# Patient Record
Sex: Male | Born: 1975
Health system: Southern US, Community
[De-identification: ages and names within clinical notes are randomized; demographics above are authoritative.]

## PROBLEM LIST (undated history)

## (undated) DIAGNOSIS — T7840XA Allergy, unspecified, initial encounter: Secondary | ICD-10-CM

## (undated) DIAGNOSIS — J45909 Unspecified asthma, uncomplicated: Secondary | ICD-10-CM

## (undated) HISTORY — DX: Unspecified asthma, uncomplicated: J45.909

## (undated) HISTORY — DX: Allergy, unspecified, initial encounter: T78.40XA

---

## 2011-04-15 ENCOUNTER — Ambulatory Visit: Payer: Self-pay | Admitting: Physician Assistant

## 2011-04-15 VITALS — BP 119/73 | HR 58 | Temp 98.3°F | Resp 16 | Ht 67.58 in | Wt 248.2 lb

## 2011-04-15 DIAGNOSIS — Z0289 Encounter for other administrative examinations: Secondary | ICD-10-CM

## 2011-04-15 NOTE — Progress Notes (Signed)
Patient ID: Johnny Barber MRN: [id number], DOB: 10-Mar-1975 36 y.o. Date of Encounter: 04/15/2011, 8:06 PM  Primary Physician: No primary provider on file.  Chief Complaint:  DOT Physical (CPE)  HPI: 36 y.o. y/o male with history noted below here for DOT CPE.  Doing well. No issues/complaints. Here for recertification. 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   Review of Systems: Consitutional: No fever, chills, fatigue, night sweats, lymphadenopathy, or weight changes. Eyes: No visual changes, eye redness, or discharge. ENT/Mouth: Ears: No otalgia, tinnitus, hearing loss, discharge. Nose: No congestion, rhinorrhea, sinus pain, or epistaxis. Throat: No sore throat, post nasal drip, or teeth pain. Cardiovascular: No CP, palpitations, diaphoresis, DOE, edema, orthopnea, PND. Respiratory: No cough, hemoptysis, SOB, or wheezing. Gastrointestinal: No anorexia, dysphagia, reflux, pain, nausea, vomiting, hematemesis, diarrhea, constipation, BRBPR, or melena. Genitourinary: No dysuria, frequency, urgency, hematuria, incontinence, nocturia, decreased urinary stream, discharge, impotence, or testicular pain/masses. Musculoskeletal: No decreased ROM, myalgias, stiffness, joint swelling, or weakness. Skin: No rash, erythema, lesion changes, pain, warmth, jaundice, or pruritis. Neurological: No headache, dizziness, syncope, seizures, tremors, memory loss, coordination problems, or paresthesias. Psychological: No anxiety, depression, hallucinations, SI/HI. Endocrine: No fatigue, polydipsia, polyphagia, polyuria, or known diabetes. All other systems were reviewed and are otherwise negative.  History reviewed. No pertinent past medical history.   History reviewed. No pertinent past surgical history.  Home Meds:  Prior to Admission medications   Not on File    Allergies: No Known Allergies  History   Social History  . Marital Status: Single   Spouse Name: N/A    Number of Children: N/A  . Years of Education: N/A   Occupational History  . Not on file.   Social History Main Topics  . Smoking status: Never Smoker   . Smokeless tobacco: Never Used  . Alcohol Use: Not on file  . Drug Use: No  . Sexually Active: Not on file   Other Topics Concern  . Not on file   Social History Narrative  . No narrative on file    History reviewed. No pertinent family history.  Physical Exam: Blood pressure 119/73, pulse 58, temperature 98.3 F (36.8 C), temperature source Oral, resp. rate 16, height 5' 7.58" (1.717 m), weight 248 lb 3.2 oz (112.583 kg).  General: Well developed, well nourished, in no acute distress. HEENT: Normocephalic, atraumatic. Conjunctiva pink, sclera non-icteric. Pupils 2 mm constricting to 1 mm, round, regular, and equally reactive to light and accomodation. EOMI. Internal auditory canal clear. TMs with good cone of light and without pathology. Nasal mucosa pink. Nares are without discharge. No sinus tenderness. Oral mucosa pink. Dentition normal. Pharynx without exudate.   Neck: Supple. Trachea midline. No thyromegaly. Full ROM. No lymphadenopathy. Lungs: Clear to auscultation bilaterally without wheezes, rales, or rhonchi. Breathing is of normal effort and unlabored. Cardiovascular: RRR with S1 S2. No murmurs, rubs, or gallops appreciated. Distal pulses 2+ symmetrically. No carotid or abdominal bruits. Abdomen: Soft, non-tender, non-distended with normoactive bowel sounds. No hepatosplenomegaly or masses. No rebound/guarding. No CVA tenderness. Without hernias.  Genitourinary: Circumcised male. No penile lesions. Testes descended bilaterally, and smooth without tenderness or masses.  Musculoskeletal: Full range of motion and 5/5 strength throughout. Without swelling, atrophy, tenderness, crepitus, or warmth. Extremities without clubbing, cyanosis, or edema. Calves supple. Skin: Warm and moist without erythema,  ecchymosis, wounds, or rash. Neuro: A+Ox3. CN II-XII grossly intact. Moves all extremities spontaneously. Full sensation throughout. Normal gait. DTR 2+  throughout upper and lower extremities. Finger to nose intact. Psych:  Responds to questions appropriately with a normal affect.    Assessment/Plan:  36 y.o. y/o male here for DOT CPE -Cleared -2 year card issued -Form completed -See scanned in form for specific data -Follow up with PCP for trace hematuria  Signed, Eula Listen, PA-C 04/15/2011 8:06 PM

## 2012-04-26 ENCOUNTER — Ambulatory Visit (INDEPENDENT_AMBULATORY_CARE_PROVIDER_SITE_OTHER): Payer: BC Managed Care – PPO | Admitting: Family Medicine

## 2012-04-26 VITALS — BP 126/78 | HR 62 | Temp 97.8°F | Resp 18 | Wt 255.0 lb

## 2012-04-26 DIAGNOSIS — E669 Obesity, unspecified: Secondary | ICD-10-CM

## 2012-04-26 DIAGNOSIS — L83 Acanthosis nigricans: Secondary | ICD-10-CM

## 2012-04-26 DIAGNOSIS — K219 Gastro-esophageal reflux disease without esophagitis: Secondary | ICD-10-CM

## 2012-04-26 DIAGNOSIS — M940 Chondrocostal junction syndrome [Tietze]: Secondary | ICD-10-CM

## 2012-04-26 DIAGNOSIS — J019 Acute sinusitis, unspecified: Secondary | ICD-10-CM

## 2012-04-26 DIAGNOSIS — R079 Chest pain, unspecified: Secondary | ICD-10-CM

## 2012-04-26 LAB — POCT CBC
Granulocyte percent: 60.4 %G (ref 37–80)
MID (cbc): 0.7 (ref 0–0.9)
POC Granulocyte: 3.8 (ref 2–6.9)
POC LYMPH PERCENT: 28.4 %L (ref 10–50)
POC MID %: 11.2 %M (ref 0–12)
Platelet Count, POC: 340 10*3/uL (ref 142–424)
RDW, POC: 13.8 %

## 2012-04-26 LAB — GLUCOSE, POCT (MANUAL RESULT ENTRY): POC Glucose: 103 mg/dl — AB (ref 70–99)

## 2012-04-26 MED ORDER — METHYLPREDNISOLONE ACETATE 80 MG/ML IJ SUSP
120.0000 mg | Freq: Once | INTRAMUSCULAR | Status: AC
  Administered 2012-04-26: 120 mg via INTRAMUSCULAR

## 2012-04-26 MED ORDER — IPRATROPIUM BROMIDE 0.03 % NA SOLN: 2.0000 | Freq: Four times a day (QID) | NASAL | Status: DC

## 2012-04-26 MED ORDER — FLUTICASONE PROPIONATE 50 MCG/ACT NA SUSP: 2.0000 | Freq: Every day | NASAL | Status: DC

## 2012-04-26 MED ORDER — MELOXICAM 15 MG PO TABS: 15.0000 mg | ORAL_TABLET | Freq: Every day | ORAL | Status: DC

## 2012-04-26 MED ORDER — RANITIDINE HCL 150 MG PO TABS: 150.0000 mg | ORAL_TABLET | Freq: Two times a day (BID) | ORAL | Status: DC

## 2012-04-26 NOTE — Progress Notes (Signed)
Subjective:    Patient ID: Johnny Barber, male    DOB: 09-Jul-1975, 37 y.o.   MRN: [id number] Chief Complaint  Patient presents with  . Chest Pain    couple days  . Allergic Rhinitis    HPI  Has had central chest pain constantly for the past few days. Nothing like this previous.  Is stabbing nature and exacerbated by sneezing.  No relieving factors.  No diaphoresis or dyspnea, no n/v, no radiation.  Not worsened by activity or exertion. No indigestion recently - does not eat pizza because of this.  Also had bad allergies and entire body is sore, sneezing a lot.  No coughing, is developing a headache as well.  Has tried some zyrtec. Notices that it does get worse when he goes away.  Is feeling like he had a fever, very fatigued, no sinus pressure but lots of nasal congestion and runny nose. No ear pain but neck feels stiff.  Tol po, nml GI/GU.    Past Medical History  Diagnosis Date  . Allergy   . Asthma    No current outpatient prescriptions on file prior to visit.   No current facility-administered medications on file prior to visit.   No Known Allergies  Review of Systems  Constitutional: Positive for fever and fatigue. Negative for chills, diaphoresis, activity change, appetite change and unexpected weight change.  HENT: Positive for congestion, sore throat, rhinorrhea, sneezing, neck stiffness and postnasal drip. Negative for ear pain, mouth sores, neck pain and sinus pressure.   Respiratory: Positive for cough. Negative for shortness of breath.   Cardiovascular: Positive for chest pain.  Gastrointestinal: Negative for nausea, vomiting, abdominal pain, diarrhea and constipation.  Genitourinary: Negative for dysuria, urgency and frequency.  Musculoskeletal: Positive for myalgias and arthralgias. Negative for joint swelling and gait problem.  Skin: Negative for rash.  Neurological: Positive for headaches. Negative for syncope.  Hematological: Negative for adenopathy.   Psychiatric/Behavioral: Positive for sleep disturbance.       BP 126/78  Pulse 62  Temp(Src) 97.8 F (36.6 C) (Oral)  Resp 18  Wt 255 lb (115.667 kg)  BMI 39.23 kg/m2  SpO2 98% Objective:   Physical Exam  Constitutional: He is oriented to person, place, and time. He appears well-developed and well-nourished. No distress.  HENT:  Head: Normocephalic and atraumatic.  Right Ear: External ear and ear canal normal. Tympanic membrane is retracted. A middle ear effusion is present.  Left Ear: External ear and ear canal normal. Tympanic membrane is retracted. A middle ear effusion is present.  Nose: Mucosal edema and rhinorrhea present. Right sinus exhibits maxillary sinus tenderness. Left sinus exhibits maxillary sinus tenderness.  Mouth/Throat: Uvula is midline and mucous membranes are normal. No oropharyngeal exudate, posterior oropharyngeal edema or posterior oropharyngeal erythema.  Bilateral canals erythematous  Bilateral temples swollen - per pt this is chronic  Eyes: Conjunctivae are normal. Right eye exhibits no discharge. Left eye exhibits no discharge. No scleral icterus.  Neck: Normal range of motion. Neck supple. No thyromegaly present.  Cardiovascular: Normal rate, regular rhythm, normal heart sounds and intact distal pulses.   Pulmonary/Chest: Effort normal and breath sounds normal. No respiratory distress. He exhibits bony tenderness. He exhibits no mass, no crepitus and no retraction.  Lymphadenopathy:       Head (left side): No submandibular adenopathy present.    He has no cervical adenopathy.       Right: No supraclavicular adenopathy present.       Left: No supraclavicular  adenopathy present.  Neurological: He is alert and oriented to person, place, and time.  Skin: Skin is warm and dry. He is not diaphoretic. No erythema.  Hyperpigmentation around posterior neck  Psychiatric: He has a normal mood and affect. His behavior is normal.   Results for orders placed in  visit on 04/26/12  POCT CBC      Result Value Range   WBC 6.3  4.6 - 10.2 K/uL   Lymph, poc 1.8  0.6 - 3.4   POC LYMPH PERCENT 28.4  10 - 50 %L   MID (cbc) 0.7  0 - 0.9   POC MID % 11.2  0 - 12 %M   POC Granulocyte 3.8  2 - 6.9   Granulocyte percent 60.4  37 - 80 %G   RBC 4.78  4.69 - 6.13 M/uL   Hemoglobin 13.9 (*) 14.1 - 18.1 g/dL   HCT, POC 01.0  27.2 - 53.7 %   MCV 91.1  80 - 97 fL   MCH, POC 29.1  27 - 31.2 pg   MCHC 32.0  31.8 - 35.4 g/dL   RDW, POC 53.6     Platelet Count, POC 340  142 - 424 K/uL   MPV 8.2  0 - 99.8 fL  GLUCOSE, POCT (MANUAL RESULT ENTRY)      Result Value Range   POC Glucose 103 (*) 70 - 99 mg/dl      [facility] reading (PRIMARY) by  Dr. Clelia Croft. EKG: NSR, no ischemic abnormalities  Assessment & Plan:  Sinusitis, acute - Plan: POCT CBC, methylPREDNISolone acetate (DEPO-MEDROL) injection 120 mg IM x 1 in office now. Try flonase, atrovent, try netti pot/sinus rinse, hot shower, humidifier, etc.  Chest pain - Plan: EKG 12-Lead - non-cardiac - suspect costochondritis as cause, RTC for further eval if continuing.  Consider   Costochondritis - Plan: methylPREDNISolone acetate (DEPO-MEDROL) injection 120 mg - try meloxicam (stay away from other nsaids due to diet-controlled gerd).  Obesity, unspecified - Plan: POCT glucose (manual entry)  Acanthosis nigricans - Plan: POCT glucose (manual entry)  GERD (gastroesophageal reflux disease) - start prn zantac  Meds ordered this encounter  Medications  . ranitidine (ZANTAC) 150 MG tablet    Sig: Take 1 tablet (150 mg total) by mouth 2 (two) times daily.    Dispense:  60 tablet    Refill:  0  . methylPREDNISolone acetate (DEPO-MEDROL) injection 120 mg    Sig:   . meloxicam (MOBIC) 15 MG tablet    Sig: Take 1 tablet (15 mg total) by mouth daily.    Dispense:  30 tablet    Refill:  0  . fluticasone (FLONASE) 50 MCG/ACT nasal spray    Sig: Place 2 sprays into the nose daily.    Dispense:  16 g    Refill:  6  .  ipratropium (ATROVENT) 0.03 % nasal spray    Sig: Place 2 sprays into the nose 4 (four) times daily.    Dispense:  30 mL    Refill:  1

## 2012-04-26 NOTE — Patient Instructions (Signed)
Hot showers or breathing in steam may help loosen the congestion.  Using a netti pot or sinus rinse is also likely to help you feel better and keep this from progressing.  Use the atrovent nasal spray as needed throughout the day and use the fluticasone nasal spray every night before bed for at least 2 weeks.  I recommend augmenting with 12 hr sudafed (behind the counter) and generic mucinex to help you move out the congestion.  If no improvement or you are getting worse, come back as you might need a course of steroids but hopefully with all of the above, you can avoid it. Sinusitis Sinusitis is redness, soreness, and swelling (inflammation) of the paranasal sinuses. Paranasal sinuses are air pockets within the bones of your face (beneath the eyes, the middle of the forehead, or above the eyes). In healthy paranasal sinuses, mucus is able to drain out, and air is able to circulate through them by way of your nose. However, when your paranasal sinuses are inflamed, mucus and air can become trapped. This can allow bacteria and other germs to grow and cause infection. Sinusitis can develop quickly and last only a short time (acute) or continue over a long period (chronic). Sinusitis that lasts for more than 12 weeks is considered chronic.  CAUSES  Causes of sinusitis include:  Allergies.  Structural abnormalities, such as displacement of the cartilage that separates your nostrils (deviated septum), which can decrease the air flow through your nose and sinuses and affect sinus drainage.  Functional abnormalities, such as when the small hairs (cilia) that line your sinuses and help remove mucus do not work properly or are not present. SYMPTOMS  Symptoms of acute and chronic sinusitis are the same. The primary symptoms are pain and pressure around the affected sinuses. Other symptoms include:  Upper toothache.  Earache.  Headache.  Bad breath.  Decreased sense of smell and taste.  A cough, which  worsens when you are lying flat.  Fatigue.  Fever.  Thick drainage from your nose, which often is green and may contain pus (purulent).  Swelling and warmth over the affected sinuses. DIAGNOSIS  Your caregiver will perform a physical exam. During the exam, your caregiver may:  Look in your nose for signs of abnormal growths in your nostrils (nasal polyps).  Tap over the affected sinus to check for signs of infection.  View the inside of your sinuses (endoscopy) with a special imaging device with a light attached (endoscope), which is inserted into your sinuses. If your caregiver suspects that you have chronic sinusitis, one or more of the following tests may be recommended:  Allergy tests.  Nasal culture A sample of mucus is taken from your nose and sent to a lab and screened for bacteria.  Nasal cytology A sample of mucus is taken from your nose and examined by your caregiver to determine if your sinusitis is related to an allergy. TREATMENT  Most cases of acute sinusitis are related to a viral infection and will resolve on their own within 10 days. Sometimes medicines are prescribed to help relieve symptoms (pain medicine, decongestants, nasal steroid sprays, or saline sprays).  However, for sinusitis related to a bacterial infection, your caregiver will prescribe antibiotic medicines. These are medicines that will help kill the bacteria causing the infection.  Rarely, sinusitis is caused by a fungal infection. In theses cases, your caregiver will prescribe antifungal medicine. For some cases of chronic sinusitis, surgery is needed. Generally, these are cases in   cases in which sinusitis recurs more than 3 times per year, despite other treatments. HOME CARE INSTRUCTIONS   Drink plenty of water. Water helps thin the mucus so your sinuses can drain more easily.  Use a humidifier.  Inhale steam 3 to 4 times a day (for example, sit in the bathroom with the shower running).  Apply a  warm, moist washcloth to your face 3 to 4 times a day, or as directed by your caregiver.  Use saline nasal sprays to help moisten and clean your sinuses.  Take over-the-counter or prescription medicines for pain, discomfort, or fever only as directed by your caregiver. SEEK IMMEDIATE MEDICAL CARE IF:  You have increasing pain or severe headaches.  You have nausea, vomiting, or drowsiness.  You have swelling around your face.  You have vision problems.  You have a stiff neck.  You have difficulty breathing. MAKE SURE YOU:   Understand these instructions.  Will watch your condition.  Will get help right away if you are not doing well or get worse. Document Released: 12/30/2004 Document Revised: 03/24/2011 Document Reviewed: 01/14/2011 [facility] Patient Information 2013 [place], [place].

## 2012-05-10 ENCOUNTER — Ambulatory Visit (INDEPENDENT_AMBULATORY_CARE_PROVIDER_SITE_OTHER): Payer: BC Managed Care – PPO | Admitting: Family Medicine

## 2012-05-10 VITALS — BP 124/72 | HR 65 | Temp 98.0°F | Resp 16 | Ht 68.5 in | Wt 255.0 lb

## 2012-05-10 DIAGNOSIS — R51 Headache: Secondary | ICD-10-CM

## 2012-05-10 DIAGNOSIS — J0100 Acute maxillary sinusitis, unspecified: Secondary | ICD-10-CM

## 2012-05-10 DIAGNOSIS — R5381 Other malaise: Secondary | ICD-10-CM

## 2012-05-10 DIAGNOSIS — R29898 Other symptoms and signs involving the musculoskeletal system: Secondary | ICD-10-CM

## 2012-05-10 DIAGNOSIS — R42 Dizziness and giddiness: Secondary | ICD-10-CM

## 2012-05-10 DIAGNOSIS — J019 Acute sinusitis, unspecified: Secondary | ICD-10-CM

## 2012-05-10 DIAGNOSIS — IMO0001 Reserved for inherently not codable concepts without codable children: Secondary | ICD-10-CM

## 2012-05-10 LAB — COMPREHENSIVE METABOLIC PANEL
ALT: 31 U/L (ref 0–53)
AST: 19 U/L (ref 0–37)
BUN: 16 mg/dL (ref 6–23)
Creat: 1.29 mg/dL (ref 0.50–1.35)
Total Bilirubin: 0.6 mg/dL (ref 0.3–1.2)

## 2012-05-10 LAB — POCT CBC
HCT, POC: 45.3 % (ref 43.5–53.7)
Hemoglobin: 14.1 g/dL (ref 14.1–18.1)
Lymph, poc: 2 (ref 0.6–3.4)
MCHC: 31.1 g/dL — AB (ref 31.8–35.4)
POC Granulocyte: 4.8 (ref 2–6.9)
WBC: 7.4 10*3/uL (ref 4.6–10.2)

## 2012-05-10 LAB — TSH: TSH: 2.245 u[IU]/mL (ref 0.350–4.500)

## 2012-05-10 LAB — GLUCOSE, POCT (MANUAL RESULT ENTRY): POC Glucose: 105 mg/dl — AB (ref 70–99)

## 2012-05-10 MED ORDER — AMOXICILLIN-POT CLAVULANATE 875-125 MG PO TABS: 1.0000 | ORAL_TABLET | Freq: Two times a day (BID) | ORAL | Status: DC

## 2012-05-10 MED ORDER — PREDNISONE 20 MG PO TABS: ORAL_TABLET | ORAL | Status: DC

## 2012-05-10 NOTE — Progress Notes (Signed)
  Subjective:    Patient ID: Johnny Barber, male    DOB: [birth year], 37 y.o.   MRN: [id number]  HPI  We gave him some depomedrol in the office which he feels better.  When he sneezes hard, he feels unconscious for about 10 sec - more like dizziness, feels like everything shuts down. it is a while before he can remember anything.  Having a posterior headache, still with a lot of nasal congestion Chest pain is gone but whoe body is sore.  Feels fever.    Review of Systems     Objective:   Physical Exam        Results for orders placed in visit on 05/10/12  GLUCOSE, POCT (MANUAL RESULT ENTRY)      Result Value Range   POC Glucose 105 (*) 70 - 99 mg/dl  POCT CBC      Result Value Range   WBC 7.4  4.6 - 10.2 K/uL   Lymph, poc 2.0  0.6 - 3.4   POC LYMPH PERCENT 26.9  10 - 50 %L   MID (cbc) 0.6  0 - 0.9   POC MID % 8.3  0 - 12 %M   POC Granulocyte 4.8  2 - 6.9   Granulocyte percent 64.8  37 - 80 %G   RBC 4.97  4.69 - 6.13 M/uL   Hemoglobin 14.1  14.1 - 18.1 g/dL   HCT, POC 40.9  81.1 - 53.7 %   MCV 91.2  80 - 97 fL   MCH, POC 28.4  27 - 31.2 pg   MCHC 31.1 (*) 31.8 - 35.4 g/dL   RDW, POC 91.4     Platelet Count, POC 360  142 - 424 K/uL   MPV 8.5  0 - 99.8 fL    Assessment & Plan:

## 2012-05-10 NOTE — Patient Instructions (Addendum)
Hot showers or breathing in steam may help loosen the congestion.  Using a netti pot or sinus rinse is also likely to help you feel better and keep this from progressing.  Make sure you do this all twice a day but especially at night, right before bed.  Use the atrovent nasal spray as needed throughout the day and use the fluticasone nasal spray every night before bed for at least 2 weeks.  I recommend augmenting with 12 hr sudafed (behind the counter) and generic mucinex to help you move out the congestion.  If no improvement or you are getting worse, come back.  Sinusitis Sinusitis is redness, soreness, and swelling (inflammation) of the paranasal sinuses. Paranasal sinuses are air pockets within the bones of your face (beneath the eyes, the middle of the forehead, or above the eyes). In healthy paranasal sinuses, mucus is able to drain out, and air is able to circulate through them by way of your nose. However, when your paranasal sinuses are inflamed, mucus and air can become trapped. This can allow bacteria and other germs to grow and cause infection. Sinusitis can develop quickly and last only a short time (acute) or continue over a long period (chronic). Sinusitis that lasts for more than 12 weeks is considered chronic.  CAUSES  Causes of sinusitis include:  Allergies.  Structural abnormalities, such as displacement of the cartilage that separates your nostrils (deviated septum), which can decrease the air flow through your nose and sinuses and affect sinus drainage.  Functional abnormalities, such as when the small hairs (cilia) that line your sinuses and help remove mucus do not work properly or are not present. SYMPTOMS  Symptoms of acute and chronic sinusitis are the same. The primary symptoms are pain and pressure around the affected sinuses. Other symptoms include:  Upper toothache.  Earache.  Headache.  Bad breath.  Decreased sense of smell and taste.  A cough, which worsens  when you are lying flat.  Fatigue.  Fever.  Thick drainage from your nose, which often is green and may contain pus (purulent).  Swelling and warmth over the affected sinuses. DIAGNOSIS  Your caregiver will perform a physical exam. During the exam, your caregiver may:  Look in your nose for signs of abnormal growths in your nostrils (nasal polyps).  Tap over the affected sinus to check for signs of infection.  View the inside of your sinuses (endoscopy) with a special imaging device with a light attached (endoscope), which is inserted into your sinuses. If your caregiver suspects that you have chronic sinusitis, one or more of the following tests may be recommended:  Allergy tests.  Nasal culture A sample of mucus is taken from your nose and sent to a lab and screened for bacteria.  Nasal cytology A sample of mucus is taken from your nose and examined by your caregiver to determine if your sinusitis is related to an allergy. TREATMENT  Most cases of acute sinusitis are related to a viral infection and will resolve on their own within 10 days. Sometimes medicines are prescribed to help relieve symptoms (pain medicine, decongestants, nasal steroid sprays, or saline sprays).  However, for sinusitis related to a bacterial infection, your caregiver will prescribe antibiotic medicines. These are medicines that will help kill the bacteria causing the infection.  Rarely, sinusitis is caused by a fungal infection. In theses cases, your caregiver will prescribe antifungal medicine. For some cases of chronic sinusitis, surgery is needed. Generally, these are cases in which  sinusitis recurs more than 3 times per year, despite other treatments. HOME CARE INSTRUCTIONS   Drink plenty of water. Water helps thin the mucus so your sinuses can drain more easily.  Use a humidifier.  Inhale steam 3 to 4 times a day (for example, sit in the bathroom with the shower running).  Apply a warm, moist  washcloth to your face 3 to 4 times a day, or as directed by your caregiver.  Use saline nasal sprays to help moisten and clean your sinuses.  Take over-the-counter or prescription medicines for pain, discomfort, or fever only as directed by your caregiver. SEEK IMMEDIATE MEDICAL CARE IF:  You have increasing pain or severe headaches.  You have nausea, vomiting, or drowsiness.  You have swelling around your face.  You have vision problems.  You have a stiff neck.  You have difficulty breathing. MAKE SURE YOU:   Understand these instructions.  Will watch your condition.  Will get help right away if you are not doing well or get worse. Document Released: 12/30/2004 Document Revised: 03/24/2011 Document Reviewed: 01/14/2011 [facility] Patient Information 2013 [place], [place].

## 2013-04-12 ENCOUNTER — Ambulatory Visit: Payer: Self-pay | Admitting: Emergency Medicine

## 2013-04-12 VITALS — BP 100/78 | HR 57 | Temp 98.3°F | Resp 16 | Ht 68.0 in | Wt 248.0 lb

## 2013-04-12 DIAGNOSIS — Z0289 Encounter for other administrative examinations: Secondary | ICD-10-CM

## 2013-04-12 NOTE — Progress Notes (Signed)
[facility] [address], [place] [id number] [phone]  Date:  04/12/2013   Name:  Johnny Barber   DOB:  [birth year]   MRN:  [id number]  PCP:  No PCP Per Patient    Chief Complaint: DOT PE   History of Present Illness:  Johnny Barber is a 38 y.o. very pleasant male patient who presents with the following:  DOT  There are no active problems to display for this patient.   Past Medical History  Diagnosis Date  . Allergy   . Asthma     No past surgical history on file.  History  Substance Use Topics  . Smoking status: Never Smoker   . Smokeless tobacco: Never Used  . Alcohol Use: Yes    No family history on file.  No Known Allergies  Medication list has been reviewed and updated.  No current outpatient prescriptions on file prior to visit.   No current facility-administered medications on file prior to visit.    Review of Systems:  As per HPI, otherwise negative.    Physical Examination: Filed Vitals:   04/12/13 1829  BP: 100/78  Pulse: 57  Temp: 98.3 F (36.8 C)  Resp: 16   Filed Vitals:   04/12/13 1829  Height: 5\' 8"  (1.727 m)  Weight: 248 lb (112.492 kg)   Body mass index is 37.72 kg/(m^2). Ideal Body Weight: Weight in (lb) to have BMI = 25: 164.1  GEN: WDWN, NAD, Non-toxic, A & O x 3 HEENT: Atraumatic, Normocephalic. Neck supple. No masses, No LAD. Ears and Nose: No external deformity. CV: RRR, No M/G/R. No JVD. No thrill. No extra heart sounds. PULM: CTA B, no wheezes, crackles, rhonchi. No retractions. No resp. distress. No accessory muscle use. ABD: S, NT, ND, +BS. No rebound. No HSM. EXTR: No c/c/e NEURO Normal gait.  PSYCH: Normally interactive. Conversant. Not depressed or anxious appearing.  Calm demeanor.    Assessment and Plan: DOT  Signed,  Phillips OdorJeffery Harnoor Kohles, MD

## 2013-04-25 ENCOUNTER — Ambulatory Visit (INDEPENDENT_AMBULATORY_CARE_PROVIDER_SITE_OTHER): Payer: BC Managed Care – PPO | Admitting: Internal Medicine

## 2013-04-25 VITALS — BP 134/84 | HR 57 | Temp 97.9°F | Resp 16 | Ht 67.25 in | Wt 248.0 lb

## 2013-04-25 DIAGNOSIS — J309 Allergic rhinitis, unspecified: Secondary | ICD-10-CM

## 2013-04-25 DIAGNOSIS — J3489 Other specified disorders of nose and nasal sinuses: Secondary | ICD-10-CM

## 2013-04-25 DIAGNOSIS — R3981 Functional urinary incontinence: Secondary | ICD-10-CM

## 2013-04-25 DIAGNOSIS — R0982 Postnasal drip: Secondary | ICD-10-CM

## 2013-04-25 DIAGNOSIS — J302 Other seasonal allergic rhinitis: Secondary | ICD-10-CM

## 2013-04-25 MED ORDER — FLUTICASONE PROPIONATE 50 MCG/ACT NA SUSP: NASAL | Status: DC

## 2013-04-25 MED ORDER — PREDNISONE 20 MG PO TABS: ORAL_TABLET | ORAL | Status: DC

## 2013-04-25 NOTE — Progress Notes (Signed)
Subjective:     Patient ID: Johnny Barber, male   DOB: 05-23-1975, 38 y.o.   MRN: [id number]  HPI 38 YO AA male [facility] with 2-3 days of nasal congestion, rhinorrhea, post nasal drip, eye itching, sneezing, and rib pain secondary to sneezing. He states that the same exact symptoms happened to him last year and were alleviated when he was given a steroid dose pack. See Chart. He is currently not taking any other medications and is not taking any acute medications to alleviate his symptoms. He says it is worse when he is outside and and that he has taken claritin in the past but is not currently taking it because it dries him out.   Review of Systems  Constitutional: Positive for activity change. Negative for fever, chills, appetite change and fatigue.  HENT: Positive for congestion, postnasal drip, rhinorrhea, sinus pressure and sneezing. Negative for ear discharge, ear pain, facial swelling, hearing loss, sore throat, tinnitus and voice change.   Eyes: Positive for pain, redness and itching. Negative for photophobia, discharge and visual disturbance.  Respiratory: Negative for cough, choking, chest tightness, shortness of breath and wheezing.   Cardiovascular: Negative for chest pain, palpitations and leg swelling.  Gastrointestinal: Negative for nausea, vomiting, abdominal pain, diarrhea, constipation, blood in stool and abdominal distention.  Endocrine: Negative for polyuria.  Genitourinary: Negative for dysuria and difficulty urinating.  Musculoskeletal: Negative for arthralgias, back pain and myalgias.       Pain in ribs secondary to sneezing  Skin: Negative for pallor and rash.  Allergic/Immunologic: Positive for environmental allergies.  Neurological: Positive for dizziness, weakness and light-headedness. Negative for tremors, syncope and headaches.       Objective:   Physical Exam  Constitutional: He is oriented to person, place, and time. He appears well-developed and  well-nourished. No distress.  HENT:  Head: Normocephalic and atraumatic.  Right Ear: External ear normal.  Left Ear: External ear normal.  Mouth/Throat: Oropharynx is clear and moist. No oropharyngeal exudate.  Eyes: Pupils are equal, round, and reactive to light. Right eye exhibits no discharge. No scleral icterus.  Red swollen eyes  Neck: Normal range of motion. Neck supple.  Cardiovascular: Normal rate, regular rhythm, normal heart sounds and intact distal pulses.  Exam reveals no gallop and no friction rub.   No murmur heard. Pulmonary/Chest: Effort normal and breath sounds normal. No respiratory distress. He has no wheezes. He has no rales. He exhibits no tenderness.  Abdominal: Soft. Bowel sounds are normal. He exhibits no distension and no mass. There is no tenderness. There is no rebound and no guarding.  Musculoskeletal: He exhibits no edema.  Neurological: He is alert and oriented to person, place, and time.  Skin: Skin is warm and dry. No rash noted. He is not diaphoretic. No erythema.       Assessment:    1) seasonal allergies 2) rhinorrhea 3) post nasal drip     Plan:    1) prednisone 12 d taper, use Claritin as tolerated for supplemental therapy, continue fluticasone 1 spray each nostril twice daily, recheck as needed or if worsening  Meds ordered this encounter  Medications  . predniSONE (DELTASONE) 20 MG tablet    Sig: 4/4/4/3/3/3/2/2/2/1/1/1single daily dose for 12 days    Dispense:  30 tablet    Refill:  0  . fluticasone (FLONASE) 50 MCG/ACT nasal spray    Sig: 1 spray each nostril twice a day    Dispense:  16 g  Refill:  6     I have completed the patient encounter in its entirety as documented by B Adams-Rad Gramling MS4, with editing by me where necessary. Nakiesha Rumsey P. Merla Richesoolittle, M.D.

## 2014-04-02 ENCOUNTER — Ambulatory Visit (INDEPENDENT_AMBULATORY_CARE_PROVIDER_SITE_OTHER): Payer: BLUE CROSS/BLUE SHIELD | Admitting: Physician Assistant

## 2014-04-02 VITALS — BP 116/64 | HR 68 | Temp 97.5°F | Resp 16 | Ht 68.25 in | Wt 238.6 lb

## 2014-04-02 DIAGNOSIS — J3089 Other allergic rhinitis: Secondary | ICD-10-CM

## 2014-04-02 MED ORDER — LEVOCETIRIZINE DIHYDROCHLORIDE 5 MG PO TABS: 5.0000 mg | ORAL_TABLET | Freq: Every evening | ORAL | Status: DC

## 2014-04-02 NOTE — Progress Notes (Signed)
   Subjective:    Patient ID: Johnny Barber, male    DOB: [birth year], 39 y.o.   MRN: [id number]  HPI Patient presents for 1 week of sneezing and itchy, watery eyes. Has also had congestion and some cough. Sneezing so hard it hurts his body. Denies fever, sinus/ear pressure, rhinorrhea, wheezing, HA, fatigue or N/V. Has seasonal allergies every year this time and the past 2 eyears has received prednisone and would like to have that again. Denies sick contacts. Has not taken any OTC remedies. Denies h/o asthma.    Review of Systems  Constitutional: Negative for fever, chills and fatigue.  HENT: Positive for congestion and sneezing. Negative for ear discharge, ear pain, rhinorrhea, sinus pressure and sore throat.   Eyes: Positive for discharge and itching. Negative for pain and redness.  Respiratory: Positive for cough. Negative for chest tightness, shortness of breath and wheezing.   Cardiovascular: Negative for chest pain.  Gastrointestinal: Negative for nausea and vomiting.  Neurological: Negative for headaches.       Objective:   Physical Exam  Constitutional: He is oriented to person, place, and time. He appears well-developed and well-nourished. No distress.  Blood pressure 116/64, pulse 68, temperature 97.5 F (36.4 C), temperature source Oral, resp. rate 16, height 5' 8.25" (1.734 m), weight 238 lb 9.6 oz (108.228 kg), SpO2 97 %.  HENT:  Head: Normocephalic and atraumatic.  Right Ear: Tympanic membrane, external ear and ear canal normal.  Left Ear: Tympanic membrane, external ear and ear canal normal.  Nose: Mucosal edema (marginal; nares are pale) and rhinorrhea present. No sinus tenderness. Right sinus exhibits no maxillary sinus tenderness and no frontal sinus tenderness. Left sinus exhibits no maxillary sinus tenderness and no frontal sinus tenderness.  Mouth/Throat: Uvula is midline, oropharynx is clear and moist and mucous membranes are normal. No oropharyngeal exudate,  posterior oropharyngeal edema or posterior oropharyngeal erythema.  Eyes: Conjunctivae are normal. Pupils are equal, round, and reactive to light. Right eye exhibits discharge. Left eye exhibits discharge. Right conjunctiva is not injected. Left conjunctiva is not injected. No scleral icterus.  Neck: Neck supple. No thyromegaly present.  Cardiovascular: Normal rate, regular rhythm and normal heart sounds.  Exam reveals no gallop and no friction rub.   No murmur heard. Pulmonary/Chest: Effort normal. No respiratory distress. He has no decreased breath sounds. He has no wheezes. He has no rhonchi. He has no rales. He exhibits no tenderness.  Lymphadenopathy:    He has no cervical adenopathy.  Neurological: He is alert and oriented to person, place, and time.  Skin: Skin is warm and dry. No rash noted. He is not diaphoretic. No erythema. No pallor.       Assessment & Plan:  1. Environmental and seasonal allergies At this time prednisone is not appropriate for this patient despite his insistence. If sx do not improve or worsen after trying Xyzal for 2 weeks then he should call back and we can discussed prednisone. Patient refuses all nasal sprays. Advised to increase fluid intake to keep mucus thin. Can use humidifier. May use ibuprofen for any pain sx. - levocetirizine (XYZAL) 5 MG tablet; Take 1 tablet (5 mg total) by mouth every evening.  Dispense: 30 tablet; Refill: 4   Shaindy Reader PA-C  [facility] 04/02/2014 1:55 PM

## 2014-04-05 NOTE — Progress Notes (Signed)
  Medical screening examination/treatment/procedure(s) were performed by non-physician practitioner and as supervising physician I was immediately available for consultation/collaboration.     

## 2014-04-06 ENCOUNTER — Telehealth: Payer: Self-pay

## 2014-04-06 NOTE — Telephone Encounter (Signed)
°  levocetirizine (XYZAL) 5 MG tablet Is not working.  [facility]  [phone]

## 2014-04-06 NOTE — Telephone Encounter (Signed)
Read through Tishira's note before calling pt. OV note says pt is to try Xyzal for two weeks before calling to get prednisone. Pt is persistent that he gets the prenisone. He states the Xyzal is making him weak and dizzy. He said he was not able to work today because of how tired he feels. Pt claims he is drinking plenty of foods. He refuses to take Xyzal. Please advise.

## 2014-04-10 MED ORDER — PREDNISONE 10 MG PO TABS: ORAL_TABLET | ORAL | Status: DC

## 2014-04-10 NOTE — Telephone Encounter (Signed)
States that Xyzal made him drowsy even after taking medication at 6 pm and could not function at work. Also made him feel weak and would like to course of prednisone. Will send short course of prednisone to pharmacy.

## 2014-04-18 ENCOUNTER — Other Ambulatory Visit: Payer: Self-pay | Admitting: Physician Assistant

## 2015-04-25 ENCOUNTER — Ambulatory Visit (INDEPENDENT_AMBULATORY_CARE_PROVIDER_SITE_OTHER): Payer: BLUE CROSS/BLUE SHIELD

## 2015-04-25 ENCOUNTER — Ambulatory Visit (INDEPENDENT_AMBULATORY_CARE_PROVIDER_SITE_OTHER): Payer: BLUE CROSS/BLUE SHIELD | Admitting: Urgent Care

## 2015-04-25 ENCOUNTER — Other Ambulatory Visit: Payer: BLUE CROSS/BLUE SHIELD

## 2015-04-25 VITALS — BP 118/74 | HR 67 | Temp 97.8°F | Resp 16 | Ht 68.0 in | Wt 240.0 lb

## 2015-04-25 DIAGNOSIS — H6983 Other specified disorders of Eustachian tube, bilateral: Secondary | ICD-10-CM

## 2015-04-25 DIAGNOSIS — H6993 Unspecified Eustachian tube disorder, bilateral: Secondary | ICD-10-CM

## 2015-04-25 DIAGNOSIS — J0100 Acute maxillary sinusitis, unspecified: Secondary | ICD-10-CM

## 2015-04-25 DIAGNOSIS — J309 Allergic rhinitis, unspecified: Secondary | ICD-10-CM

## 2015-04-25 DIAGNOSIS — R0789 Other chest pain: Secondary | ICD-10-CM

## 2015-04-25 MED ORDER — MONTELUKAST SODIUM 10 MG PO TABS: 10.0000 mg | ORAL_TABLET | Freq: Every day | ORAL | Status: DC

## 2015-04-25 MED ORDER — CETIRIZINE HCL 10 MG PO TABS: 10.0000 mg | ORAL_TABLET | Freq: Every day | ORAL | Status: DC

## 2015-04-25 MED ORDER — FLUTICASONE PROPIONATE 50 MCG/ACT NA SUSP: 2.0000 | Freq: Every day | NASAL | Status: DC

## 2015-04-25 MED ORDER — PREDNISONE 20 MG PO TABS: ORAL_TABLET | ORAL | Status: DC

## 2015-04-25 MED ORDER — AMOXICILLIN-POT CLAVULANATE 875-125 MG PO TABS: 1.0000 | ORAL_TABLET | Freq: Two times a day (BID) | ORAL | Status: DC

## 2015-04-25 MED ORDER — PSEUDOEPHEDRINE HCL ER 120 MG PO TB12: 120.0000 mg | ORAL_TABLET | Freq: Two times a day (BID) | ORAL | Status: DC

## 2015-04-25 NOTE — Progress Notes (Signed)
    MRN: [id number] DOB: May 19, 1975  Subjective:   Leeanne MannanSteven Shomaker is a 40 y.o. male presenting for chief complaint of Allergies; Sinusitis; and Nasal Congestion  Reports 3 week history of worsening congestion, ear popping, ear pain, sinus headaches, has difficulty breathing through his nose and is affecting his sleep, has sneezing, sneezing cause diffuse discomfort in his body. Has been taking Xyzal with minimal relief. Denies fever, sore throat, cough. Denies smoking cigarettes. No pets at home. Had asthma until his early 20's.  Viviann SpareSteven has a current medication list which includes the following prescription(s): levocetirizine and prednisone. Also has No Known Allergies.  Viviann SpareSteven  has a past medical history of Allergy and Asthma. Also  has no past surgical history on file.  Objective:   Vitals: BP 118/74 mmHg  Pulse 67  Temp(Src) 97.8 F (36.6 C) (Oral)  Resp 16  Ht 5\' 8"  (1.727 m)  Wt 240 lb (108.863 kg)  BMI 36.50 kg/m2  SpO2 98%  Physical Exam  Constitutional: He is oriented to person, place, and time. He appears well-developed and well-nourished.  HENT:  TM's flat bilaterally, no effusions or erythema. Nasal turbinates erythematous with thick clear mucus. Bilateral maxillary sinus tenderness. Postnasal drip present, without oropharyngeal exudates, erythema or abscesses.  Eyes: Right eye exhibits no discharge. Left eye exhibits no discharge. No scleral icterus.  Neck: Normal range of motion. Neck supple.  Cardiovascular: Normal rate, regular rhythm and intact distal pulses.  Exam reveals no gallop and no friction rub.   No murmur heard. Pulmonary/Chest: No respiratory distress. He has no wheezes. He has no rales.  Slightly decreased lung sounds.  Lymphadenopathy:    He has no cervical adenopathy.  Neurological: He is alert and oriented to person, place, and time.  Skin: Skin is warm and dry.   Dg Chest 2 View  04/25/2015  CLINICAL DATA:  Chest congestion. EXAM: CHEST  2  VIEW COMPARISON:  None. FINDINGS: The heart size and mediastinal contours are within normal limits. Both lungs are clear. The visualized skeletal structures are unremarkable. IMPRESSION: No active cardiopulmonary disease. Electronically Signed   By: Lupita RaiderJames  Green Jr, M.D.   On: 04/25/2015 19:12    Assessment and Plan :   1. Allergic rhinitis, unspecified allergic rhinitis type 2. Acute maxillary sinusitis, recurrence not specified 3. Eustachian tube dysfunction, bilateral 4. Atypical chest pain - Start prednisone and Augmentin. Continue aggressive allergy management with Zyrtec, Flonase, Sudafed. If not better despite aggressive allergy treatment, start Singulair. Referral to Allergist pending.  Wallis BambergMario Charina Fons, PA-C [facility] [phone] 04/25/2015 6:20 PM

## 2015-04-25 NOTE — Patient Instructions (Addendum)
Start with prednisone and Augmentin. Once you finish the prednisone, then start Zyrtec, Flonase and Sudafed. If your allergies persist in 2 weeks, start Singulair.    Allergic Rhinitis Allergic rhinitis is when the mucous membranes in the nose respond to allergens. Allergens are particles in the air that cause your body to have an allergic reaction. This causes you to release allergic antibodies. Through a chain of events, these eventually cause you to release histamine into the blood stream. Although meant to protect the body, it is this release of histamine that causes your discomfort, such as frequent sneezing, congestion, and an itchy, runny nose.  CAUSES Seasonal allergic rhinitis (hay fever) is caused by pollen allergens that may come from grasses, trees, and weeds. Year-round allergic rhinitis (perennial allergic rhinitis) is caused by allergens such as house dust mites, pet dander, and mold spores. SYMPTOMS  Nasal stuffiness (congestion).  Itchy, runny nose with sneezing and tearing of the eyes. DIAGNOSIS Your health care provider can help you determine the allergen or allergens that trigger your symptoms. If you and your health care provider are unable to determine the allergen, skin or blood testing may be used. Your health care provider will diagnose your condition after taking your health history and performing a physical exam. Your health care provider may assess you for other related conditions, such as asthma, pink eye, or an ear infection. TREATMENT Allergic rhinitis does not have a cure, but it can be controlled by:  Medicines that block allergy symptoms. These may include allergy shots, nasal sprays, and oral antihistamines.  Avoiding the allergen. Hay fever may often be treated with antihistamines in pill or nasal spray forms. Antihistamines block the effects of histamine. There are over-the-counter medicines that may help with nasal congestion and swelling around the eyes.  Check with your health care provider before taking or giving this medicine. If avoiding the allergen or the medicine prescribed do not work, there are many new medicines your health care provider can prescribe. Stronger medicine may be used if initial measures are ineffective. Desensitizing injections can be used if medicine and avoidance does not work. Desensitization is when a patient is given ongoing shots until the body becomes less sensitive to the allergen. Make sure you follow up with your health care provider if problems continue. HOME CARE INSTRUCTIONS It is not possible to completely avoid allergens, but you can reduce your symptoms by taking steps to limit your exposure to them. It helps to know exactly what you are allergic to so that you can avoid your specific triggers. SEEK MEDICAL CARE IF:  You have a fever.  You develop a cough that does not stop easily (persistent).  You have shortness of breath.  You start wheezing.  Symptoms interfere with normal daily activities.   This information is not intended to replace advice given to you by your health care provider. Make sure you discuss any questions you have with your health care provider.   Document Released: 09/24/2000 Document Revised: 01/20/2014 Document Reviewed: 09/06/2012 Elsevier Interactive Patient Education 2016 Elsevier Inc.    Sinusitis, Adult Sinusitis is redness, soreness, and inflammation of the paranasal sinuses. Paranasal sinuses are air pockets within the bones of your face. They are located beneath your eyes, in the middle of your forehead, and above your eyes. In healthy paranasal sinuses, mucus is able to drain out, and air is able to circulate through them by way of your nose. However, when your paranasal sinuses are inflamed, mucus and air  can become trapped. This can allow bacteria and other germs to grow and cause infection. Sinusitis can develop quickly and last only a short time (acute) or continue  over a long period (chronic). Sinusitis that lasts for more than 12 weeks is considered chronic. CAUSES Causes of sinusitis include:  Allergies.  Structural abnormalities, such as displacement of the cartilage that separates your nostrils (deviated septum), which can decrease the air flow through your nose and sinuses and affect sinus drainage.  Functional abnormalities, such as when the small hairs (cilia) that line your sinuses and help remove mucus do not work properly or are not present. SIGNS AND SYMPTOMS Symptoms of acute and chronic sinusitis are the same. The primary symptoms are pain and pressure around the affected sinuses. Other symptoms include:  Upper toothache.  Earache.  Headache.  Bad breath.  Decreased sense of smell and taste.  A cough, which worsens when you are lying flat.  Fatigue.  Fever.  Thick drainage from your nose, which often is green and may contain pus (purulent).  Swelling and warmth over the affected sinuses. DIAGNOSIS Your health care provider will perform a physical exam. During your exam, your health care provider may perform any of the following to help determine if you have acute sinusitis or chronic sinusitis:  Look in your nose for signs of abnormal growths in your nostrils (nasal polyps).  Tap over the affected sinus to check for signs of infection.  View the inside of your sinuses using an imaging device that has a light attached (endoscope). If your health care provider suspects that you have chronic sinusitis, one or more of the following tests may be recommended:  Allergy tests.  Nasal culture. A sample of mucus is taken from your nose, sent to a lab, and screened for bacteria.  Nasal cytology. A sample of mucus is taken from your nose and examined by your health care provider to determine if your sinusitis is related to an allergy. TREATMENT Most cases of acute sinusitis are related to a viral infection and will resolve on  their own within 10 days. Sometimes, medicines are prescribed to help relieve symptoms of both acute and chronic sinusitis. These may include pain medicines, decongestants, nasal steroid sprays, or saline sprays. However, for sinusitis related to a bacterial infection, your health care provider will prescribe antibiotic medicines. These are medicines that will help kill the bacteria causing the infection. Rarely, sinusitis is caused by a fungal infection. In these cases, your health care provider will prescribe antifungal medicine. For some cases of chronic sinusitis, surgery is needed. Generally, these are cases in which sinusitis recurs more than 3 times per year, despite other treatments. HOME CARE INSTRUCTIONS  Drink plenty of water. Water helps thin the mucus so your sinuses can drain more easily.  Use a humidifier.  Inhale steam 3-4 times a day (for example, sit in the bathroom with the shower running).  Apply a warm, moist washcloth to your face 3-4 times a day, or as directed by your health care provider.  Use saline nasal sprays to help moisten and clean your sinuses.  Take medicines only as directed by your health care provider.  If you were prescribed either an antibiotic or antifungal medicine, finish it all even if you start to feel better. SEEK IMMEDIATE MEDICAL CARE IF:  You have increasing pain or severe headaches.  You have nausea, vomiting, or drowsiness.  You have swelling around your face.  You have vision problems.  You  have a stiff neck.  You have difficulty breathing.   This information is not intended to replace advice given to you by your health care provider. Make sure you discuss any questions you have with your health care provider.   Document Released: 12/30/2004 Document Revised: 01/20/2014 Document Reviewed: 01/14/2011 Elsevier Interactive Patient Education 2016 [employer]     IF you received an x-ray today, you will receive an invoice from  [facility]. Please contact [facility] at [phone] with questions or concerns regarding your invoice.   IF you received labwork today, you will receive an invoice from [facility]. Please contact [facility] at [phone] with questions or concerns regarding your invoice.   Our billing staff will not be able to assist you with questions regarding bills from these companies.  You will be contacted with the lab results as soon as they are available. The fastest way to get your results is to activate your My Chart account. Instructions are located on the last page of this paperwork. If you have not heard from us regarding the results in 2 weeks, please contact this office.

## 2015-11-14 DIAGNOSIS — J3081 Allergic rhinitis due to animal (cat) (dog) hair and dander: Secondary | ICD-10-CM | POA: Diagnosis not present

## 2015-11-14 DIAGNOSIS — J301 Allergic rhinitis due to pollen: Secondary | ICD-10-CM | POA: Diagnosis not present

## 2015-11-14 DIAGNOSIS — J3089 Other allergic rhinitis: Secondary | ICD-10-CM | POA: Diagnosis not present

## 2015-11-16 DIAGNOSIS — J301 Allergic rhinitis due to pollen: Secondary | ICD-10-CM | POA: Diagnosis not present

## 2015-11-16 DIAGNOSIS — J3081 Allergic rhinitis due to animal (cat) (dog) hair and dander: Secondary | ICD-10-CM | POA: Diagnosis not present

## 2015-11-16 DIAGNOSIS — J3089 Other allergic rhinitis: Secondary | ICD-10-CM | POA: Diagnosis not present

## 2015-11-21 DIAGNOSIS — J3089 Other allergic rhinitis: Secondary | ICD-10-CM | POA: Diagnosis not present

## 2015-11-21 DIAGNOSIS — J301 Allergic rhinitis due to pollen: Secondary | ICD-10-CM | POA: Diagnosis not present

## 2015-11-21 DIAGNOSIS — J3081 Allergic rhinitis due to animal (cat) (dog) hair and dander: Secondary | ICD-10-CM | POA: Diagnosis not present

## 2015-11-23 DIAGNOSIS — J3089 Other allergic rhinitis: Secondary | ICD-10-CM | POA: Diagnosis not present

## 2015-11-23 DIAGNOSIS — J3081 Allergic rhinitis due to animal (cat) (dog) hair and dander: Secondary | ICD-10-CM | POA: Diagnosis not present

## 2015-11-23 DIAGNOSIS — J301 Allergic rhinitis due to pollen: Secondary | ICD-10-CM | POA: Diagnosis not present

## 2015-11-26 DIAGNOSIS — J301 Allergic rhinitis due to pollen: Secondary | ICD-10-CM | POA: Diagnosis not present

## 2015-11-26 DIAGNOSIS — J3081 Allergic rhinitis due to animal (cat) (dog) hair and dander: Secondary | ICD-10-CM | POA: Diagnosis not present

## 2015-11-26 DIAGNOSIS — J3089 Other allergic rhinitis: Secondary | ICD-10-CM | POA: Diagnosis not present

## 2015-12-03 DIAGNOSIS — J3081 Allergic rhinitis due to animal (cat) (dog) hair and dander: Secondary | ICD-10-CM | POA: Diagnosis not present

## 2015-12-03 DIAGNOSIS — J3089 Other allergic rhinitis: Secondary | ICD-10-CM | POA: Diagnosis not present

## 2015-12-03 DIAGNOSIS — J301 Allergic rhinitis due to pollen: Secondary | ICD-10-CM | POA: Diagnosis not present

## 2015-12-03 DIAGNOSIS — H1045 Other chronic allergic conjunctivitis: Secondary | ICD-10-CM | POA: Diagnosis not present

## 2015-12-04 DIAGNOSIS — J3089 Other allergic rhinitis: Secondary | ICD-10-CM | POA: Diagnosis not present

## 2015-12-04 DIAGNOSIS — J3081 Allergic rhinitis due to animal (cat) (dog) hair and dander: Secondary | ICD-10-CM | POA: Diagnosis not present

## 2015-12-04 DIAGNOSIS — J301 Allergic rhinitis due to pollen: Secondary | ICD-10-CM | POA: Diagnosis not present

## 2015-12-12 DIAGNOSIS — J3081 Allergic rhinitis due to animal (cat) (dog) hair and dander: Secondary | ICD-10-CM | POA: Diagnosis not present

## 2015-12-12 DIAGNOSIS — J3089 Other allergic rhinitis: Secondary | ICD-10-CM | POA: Diagnosis not present

## 2015-12-12 DIAGNOSIS — J301 Allergic rhinitis due to pollen: Secondary | ICD-10-CM | POA: Diagnosis not present

## 2015-12-19 DIAGNOSIS — J3081 Allergic rhinitis due to animal (cat) (dog) hair and dander: Secondary | ICD-10-CM | POA: Diagnosis not present

## 2015-12-19 DIAGNOSIS — J3089 Other allergic rhinitis: Secondary | ICD-10-CM | POA: Diagnosis not present

## 2015-12-19 DIAGNOSIS — J301 Allergic rhinitis due to pollen: Secondary | ICD-10-CM | POA: Diagnosis not present

## 2015-12-26 DIAGNOSIS — J3081 Allergic rhinitis due to animal (cat) (dog) hair and dander: Secondary | ICD-10-CM | POA: Diagnosis not present

## 2015-12-26 DIAGNOSIS — J301 Allergic rhinitis due to pollen: Secondary | ICD-10-CM | POA: Diagnosis not present

## 2015-12-26 DIAGNOSIS — J3089 Other allergic rhinitis: Secondary | ICD-10-CM | POA: Diagnosis not present

## 2016-01-02 DIAGNOSIS — J3081 Allergic rhinitis due to animal (cat) (dog) hair and dander: Secondary | ICD-10-CM | POA: Diagnosis not present

## 2016-01-02 DIAGNOSIS — J3089 Other allergic rhinitis: Secondary | ICD-10-CM | POA: Diagnosis not present

## 2016-01-02 DIAGNOSIS — J301 Allergic rhinitis due to pollen: Secondary | ICD-10-CM | POA: Diagnosis not present

## 2016-01-09 DIAGNOSIS — J3089 Other allergic rhinitis: Secondary | ICD-10-CM | POA: Diagnosis not present

## 2016-01-09 DIAGNOSIS — J301 Allergic rhinitis due to pollen: Secondary | ICD-10-CM | POA: Diagnosis not present

## 2016-01-09 DIAGNOSIS — J3081 Allergic rhinitis due to animal (cat) (dog) hair and dander: Secondary | ICD-10-CM | POA: Diagnosis not present

## 2016-01-17 DIAGNOSIS — J301 Allergic rhinitis due to pollen: Secondary | ICD-10-CM | POA: Diagnosis not present

## 2016-01-17 DIAGNOSIS — J3089 Other allergic rhinitis: Secondary | ICD-10-CM | POA: Diagnosis not present

## 2016-01-17 DIAGNOSIS — J3081 Allergic rhinitis due to animal (cat) (dog) hair and dander: Secondary | ICD-10-CM | POA: Diagnosis not present

## 2016-01-23 DIAGNOSIS — J3089 Other allergic rhinitis: Secondary | ICD-10-CM | POA: Diagnosis not present

## 2016-01-23 DIAGNOSIS — J301 Allergic rhinitis due to pollen: Secondary | ICD-10-CM | POA: Diagnosis not present

## 2016-01-23 DIAGNOSIS — J3081 Allergic rhinitis due to animal (cat) (dog) hair and dander: Secondary | ICD-10-CM | POA: Diagnosis not present

## 2016-02-04 DIAGNOSIS — J301 Allergic rhinitis due to pollen: Secondary | ICD-10-CM | POA: Diagnosis not present

## 2016-02-04 DIAGNOSIS — J3089 Other allergic rhinitis: Secondary | ICD-10-CM | POA: Diagnosis not present

## 2016-02-04 DIAGNOSIS — J3081 Allergic rhinitis due to animal (cat) (dog) hair and dander: Secondary | ICD-10-CM | POA: Diagnosis not present

## 2016-02-13 DIAGNOSIS — J301 Allergic rhinitis due to pollen: Secondary | ICD-10-CM | POA: Diagnosis not present

## 2016-02-13 DIAGNOSIS — J3089 Other allergic rhinitis: Secondary | ICD-10-CM | POA: Diagnosis not present

## 2016-02-13 DIAGNOSIS — J3081 Allergic rhinitis due to animal (cat) (dog) hair and dander: Secondary | ICD-10-CM | POA: Diagnosis not present

## 2016-02-19 DIAGNOSIS — J301 Allergic rhinitis due to pollen: Secondary | ICD-10-CM | POA: Diagnosis not present

## 2016-02-19 DIAGNOSIS — J3081 Allergic rhinitis due to animal (cat) (dog) hair and dander: Secondary | ICD-10-CM | POA: Diagnosis not present

## 2016-02-19 DIAGNOSIS — J3089 Other allergic rhinitis: Secondary | ICD-10-CM | POA: Diagnosis not present

## 2016-02-25 DIAGNOSIS — J3081 Allergic rhinitis due to animal (cat) (dog) hair and dander: Secondary | ICD-10-CM | POA: Diagnosis not present

## 2016-02-25 DIAGNOSIS — J301 Allergic rhinitis due to pollen: Secondary | ICD-10-CM | POA: Diagnosis not present

## 2016-02-25 DIAGNOSIS — J3089 Other allergic rhinitis: Secondary | ICD-10-CM | POA: Diagnosis not present

## 2016-03-04 DIAGNOSIS — J301 Allergic rhinitis due to pollen: Secondary | ICD-10-CM | POA: Diagnosis not present

## 2016-03-04 DIAGNOSIS — J3081 Allergic rhinitis due to animal (cat) (dog) hair and dander: Secondary | ICD-10-CM | POA: Diagnosis not present

## 2016-03-04 DIAGNOSIS — J3089 Other allergic rhinitis: Secondary | ICD-10-CM | POA: Diagnosis not present

## 2016-03-11 DIAGNOSIS — J3081 Allergic rhinitis due to animal (cat) (dog) hair and dander: Secondary | ICD-10-CM | POA: Diagnosis not present

## 2016-03-11 DIAGNOSIS — J301 Allergic rhinitis due to pollen: Secondary | ICD-10-CM | POA: Diagnosis not present

## 2016-03-11 DIAGNOSIS — J3089 Other allergic rhinitis: Secondary | ICD-10-CM | POA: Diagnosis not present

## 2016-03-17 DIAGNOSIS — J301 Allergic rhinitis due to pollen: Secondary | ICD-10-CM | POA: Diagnosis not present

## 2016-03-18 DIAGNOSIS — J3089 Other allergic rhinitis: Secondary | ICD-10-CM | POA: Diagnosis not present

## 2016-03-18 DIAGNOSIS — J3081 Allergic rhinitis due to animal (cat) (dog) hair and dander: Secondary | ICD-10-CM | POA: Diagnosis not present

## 2016-03-18 DIAGNOSIS — J301 Allergic rhinitis due to pollen: Secondary | ICD-10-CM | POA: Diagnosis not present

## 2016-03-25 DIAGNOSIS — J3081 Allergic rhinitis due to animal (cat) (dog) hair and dander: Secondary | ICD-10-CM | POA: Diagnosis not present

## 2016-03-25 DIAGNOSIS — J301 Allergic rhinitis due to pollen: Secondary | ICD-10-CM | POA: Diagnosis not present

## 2016-03-25 DIAGNOSIS — J3089 Other allergic rhinitis: Secondary | ICD-10-CM | POA: Diagnosis not present

## 2016-04-02 DIAGNOSIS — J3081 Allergic rhinitis due to animal (cat) (dog) hair and dander: Secondary | ICD-10-CM | POA: Diagnosis not present

## 2016-04-02 DIAGNOSIS — J301 Allergic rhinitis due to pollen: Secondary | ICD-10-CM | POA: Diagnosis not present

## 2016-04-02 DIAGNOSIS — J3089 Other allergic rhinitis: Secondary | ICD-10-CM | POA: Diagnosis not present

## 2016-04-08 DIAGNOSIS — J3089 Other allergic rhinitis: Secondary | ICD-10-CM | POA: Diagnosis not present

## 2016-04-08 DIAGNOSIS — J301 Allergic rhinitis due to pollen: Secondary | ICD-10-CM | POA: Diagnosis not present

## 2016-04-08 DIAGNOSIS — J3081 Allergic rhinitis due to animal (cat) (dog) hair and dander: Secondary | ICD-10-CM | POA: Diagnosis not present

## 2016-04-14 DIAGNOSIS — J301 Allergic rhinitis due to pollen: Secondary | ICD-10-CM | POA: Diagnosis not present

## 2016-04-14 DIAGNOSIS — J3089 Other allergic rhinitis: Secondary | ICD-10-CM | POA: Diagnosis not present

## 2016-04-14 DIAGNOSIS — J3081 Allergic rhinitis due to animal (cat) (dog) hair and dander: Secondary | ICD-10-CM | POA: Diagnosis not present

## 2016-04-16 DIAGNOSIS — J3089 Other allergic rhinitis: Secondary | ICD-10-CM | POA: Diagnosis not present

## 2016-04-16 DIAGNOSIS — J301 Allergic rhinitis due to pollen: Secondary | ICD-10-CM | POA: Diagnosis not present

## 2016-04-16 DIAGNOSIS — J3081 Allergic rhinitis due to animal (cat) (dog) hair and dander: Secondary | ICD-10-CM | POA: Diagnosis not present

## 2016-04-21 DIAGNOSIS — J301 Allergic rhinitis due to pollen: Secondary | ICD-10-CM | POA: Diagnosis not present

## 2016-04-21 DIAGNOSIS — J3089 Other allergic rhinitis: Secondary | ICD-10-CM | POA: Diagnosis not present

## 2016-04-21 DIAGNOSIS — J3081 Allergic rhinitis due to animal (cat) (dog) hair and dander: Secondary | ICD-10-CM | POA: Diagnosis not present

## 2016-04-23 DIAGNOSIS — J3089 Other allergic rhinitis: Secondary | ICD-10-CM | POA: Diagnosis not present

## 2016-04-23 DIAGNOSIS — J301 Allergic rhinitis due to pollen: Secondary | ICD-10-CM | POA: Diagnosis not present

## 2016-04-23 DIAGNOSIS — J3081 Allergic rhinitis due to animal (cat) (dog) hair and dander: Secondary | ICD-10-CM | POA: Diagnosis not present

## 2016-04-28 DIAGNOSIS — J3081 Allergic rhinitis due to animal (cat) (dog) hair and dander: Secondary | ICD-10-CM | POA: Diagnosis not present

## 2016-04-28 DIAGNOSIS — J3089 Other allergic rhinitis: Secondary | ICD-10-CM | POA: Diagnosis not present

## 2016-04-28 DIAGNOSIS — J301 Allergic rhinitis due to pollen: Secondary | ICD-10-CM | POA: Diagnosis not present

## 2016-05-05 DIAGNOSIS — J3089 Other allergic rhinitis: Secondary | ICD-10-CM | POA: Diagnosis not present

## 2016-05-05 DIAGNOSIS — J301 Allergic rhinitis due to pollen: Secondary | ICD-10-CM | POA: Diagnosis not present

## 2016-05-05 DIAGNOSIS — J3081 Allergic rhinitis due to animal (cat) (dog) hair and dander: Secondary | ICD-10-CM | POA: Diagnosis not present

## 2016-05-12 DIAGNOSIS — J301 Allergic rhinitis due to pollen: Secondary | ICD-10-CM | POA: Diagnosis not present

## 2016-05-12 DIAGNOSIS — J3081 Allergic rhinitis due to animal (cat) (dog) hair and dander: Secondary | ICD-10-CM | POA: Diagnosis not present

## 2016-05-12 DIAGNOSIS — J3089 Other allergic rhinitis: Secondary | ICD-10-CM | POA: Diagnosis not present

## 2016-05-19 DIAGNOSIS — J3089 Other allergic rhinitis: Secondary | ICD-10-CM | POA: Diagnosis not present

## 2016-05-19 DIAGNOSIS — J3081 Allergic rhinitis due to animal (cat) (dog) hair and dander: Secondary | ICD-10-CM | POA: Diagnosis not present

## 2016-05-19 DIAGNOSIS — J301 Allergic rhinitis due to pollen: Secondary | ICD-10-CM | POA: Diagnosis not present

## 2016-05-30 DIAGNOSIS — J3089 Other allergic rhinitis: Secondary | ICD-10-CM | POA: Diagnosis not present

## 2016-05-30 DIAGNOSIS — J301 Allergic rhinitis due to pollen: Secondary | ICD-10-CM | POA: Diagnosis not present

## 2016-05-30 DIAGNOSIS — J3081 Allergic rhinitis due to animal (cat) (dog) hair and dander: Secondary | ICD-10-CM | POA: Diagnosis not present

## 2016-06-04 DIAGNOSIS — J3081 Allergic rhinitis due to animal (cat) (dog) hair and dander: Secondary | ICD-10-CM | POA: Diagnosis not present

## 2016-06-04 DIAGNOSIS — J301 Allergic rhinitis due to pollen: Secondary | ICD-10-CM | POA: Diagnosis not present

## 2016-06-04 DIAGNOSIS — J3089 Other allergic rhinitis: Secondary | ICD-10-CM | POA: Diagnosis not present

## 2016-06-11 DIAGNOSIS — J3089 Other allergic rhinitis: Secondary | ICD-10-CM | POA: Diagnosis not present

## 2016-06-11 DIAGNOSIS — J3081 Allergic rhinitis due to animal (cat) (dog) hair and dander: Secondary | ICD-10-CM | POA: Diagnosis not present

## 2016-06-11 DIAGNOSIS — J301 Allergic rhinitis due to pollen: Secondary | ICD-10-CM | POA: Diagnosis not present

## 2016-06-18 DIAGNOSIS — J3089 Other allergic rhinitis: Secondary | ICD-10-CM | POA: Diagnosis not present

## 2016-06-18 DIAGNOSIS — J3081 Allergic rhinitis due to animal (cat) (dog) hair and dander: Secondary | ICD-10-CM | POA: Diagnosis not present

## 2016-06-18 DIAGNOSIS — J301 Allergic rhinitis due to pollen: Secondary | ICD-10-CM | POA: Diagnosis not present

## 2016-06-25 DIAGNOSIS — J301 Allergic rhinitis due to pollen: Secondary | ICD-10-CM | POA: Diagnosis not present

## 2016-06-25 DIAGNOSIS — J3089 Other allergic rhinitis: Secondary | ICD-10-CM | POA: Diagnosis not present

## 2016-06-25 DIAGNOSIS — J3081 Allergic rhinitis due to animal (cat) (dog) hair and dander: Secondary | ICD-10-CM | POA: Diagnosis not present

## 2016-07-02 DIAGNOSIS — J301 Allergic rhinitis due to pollen: Secondary | ICD-10-CM | POA: Diagnosis not present

## 2016-07-02 DIAGNOSIS — J3089 Other allergic rhinitis: Secondary | ICD-10-CM | POA: Diagnosis not present

## 2016-07-02 DIAGNOSIS — J3081 Allergic rhinitis due to animal (cat) (dog) hair and dander: Secondary | ICD-10-CM | POA: Diagnosis not present

## 2016-07-09 DIAGNOSIS — J3089 Other allergic rhinitis: Secondary | ICD-10-CM | POA: Diagnosis not present

## 2016-07-09 DIAGNOSIS — J3081 Allergic rhinitis due to animal (cat) (dog) hair and dander: Secondary | ICD-10-CM | POA: Diagnosis not present

## 2016-07-09 DIAGNOSIS — J301 Allergic rhinitis due to pollen: Secondary | ICD-10-CM | POA: Diagnosis not present

## 2016-07-23 DIAGNOSIS — J301 Allergic rhinitis due to pollen: Secondary | ICD-10-CM | POA: Diagnosis not present

## 2016-07-23 DIAGNOSIS — J3081 Allergic rhinitis due to animal (cat) (dog) hair and dander: Secondary | ICD-10-CM | POA: Diagnosis not present

## 2016-07-23 DIAGNOSIS — J3089 Other allergic rhinitis: Secondary | ICD-10-CM | POA: Diagnosis not present

## 2016-07-30 DIAGNOSIS — J3081 Allergic rhinitis due to animal (cat) (dog) hair and dander: Secondary | ICD-10-CM | POA: Diagnosis not present

## 2016-07-30 DIAGNOSIS — J3089 Other allergic rhinitis: Secondary | ICD-10-CM | POA: Diagnosis not present

## 2016-07-30 DIAGNOSIS — J301 Allergic rhinitis due to pollen: Secondary | ICD-10-CM | POA: Diagnosis not present

## 2016-08-06 DIAGNOSIS — J301 Allergic rhinitis due to pollen: Secondary | ICD-10-CM | POA: Diagnosis not present

## 2016-08-06 DIAGNOSIS — J3089 Other allergic rhinitis: Secondary | ICD-10-CM | POA: Diagnosis not present

## 2016-08-06 DIAGNOSIS — J3081 Allergic rhinitis due to animal (cat) (dog) hair and dander: Secondary | ICD-10-CM | POA: Diagnosis not present

## 2016-08-13 DIAGNOSIS — J3081 Allergic rhinitis due to animal (cat) (dog) hair and dander: Secondary | ICD-10-CM | POA: Diagnosis not present

## 2016-08-13 DIAGNOSIS — J301 Allergic rhinitis due to pollen: Secondary | ICD-10-CM | POA: Diagnosis not present

## 2016-08-13 DIAGNOSIS — J3089 Other allergic rhinitis: Secondary | ICD-10-CM | POA: Diagnosis not present

## 2016-08-15 DIAGNOSIS — J301 Allergic rhinitis due to pollen: Secondary | ICD-10-CM | POA: Diagnosis not present

## 2016-08-15 DIAGNOSIS — J3089 Other allergic rhinitis: Secondary | ICD-10-CM | POA: Diagnosis not present

## 2016-08-15 DIAGNOSIS — J3081 Allergic rhinitis due to animal (cat) (dog) hair and dander: Secondary | ICD-10-CM | POA: Diagnosis not present

## 2016-08-20 DIAGNOSIS — J301 Allergic rhinitis due to pollen: Secondary | ICD-10-CM | POA: Diagnosis not present

## 2016-08-20 DIAGNOSIS — J3081 Allergic rhinitis due to animal (cat) (dog) hair and dander: Secondary | ICD-10-CM | POA: Diagnosis not present

## 2016-08-20 DIAGNOSIS — J3089 Other allergic rhinitis: Secondary | ICD-10-CM | POA: Diagnosis not present

## 2016-08-27 DIAGNOSIS — J301 Allergic rhinitis due to pollen: Secondary | ICD-10-CM | POA: Diagnosis not present

## 2016-08-27 DIAGNOSIS — J3081 Allergic rhinitis due to animal (cat) (dog) hair and dander: Secondary | ICD-10-CM | POA: Diagnosis not present

## 2016-08-27 DIAGNOSIS — J3089 Other allergic rhinitis: Secondary | ICD-10-CM | POA: Diagnosis not present

## 2016-09-23 DIAGNOSIS — J301 Allergic rhinitis due to pollen: Secondary | ICD-10-CM | POA: Diagnosis not present

## 2016-09-23 DIAGNOSIS — J3089 Other allergic rhinitis: Secondary | ICD-10-CM | POA: Diagnosis not present

## 2016-09-23 DIAGNOSIS — J3081 Allergic rhinitis due to animal (cat) (dog) hair and dander: Secondary | ICD-10-CM | POA: Diagnosis not present

## 2016-10-01 DIAGNOSIS — J3089 Other allergic rhinitis: Secondary | ICD-10-CM | POA: Diagnosis not present

## 2016-10-01 DIAGNOSIS — J3081 Allergic rhinitis due to animal (cat) (dog) hair and dander: Secondary | ICD-10-CM | POA: Diagnosis not present

## 2016-10-01 DIAGNOSIS — J301 Allergic rhinitis due to pollen: Secondary | ICD-10-CM | POA: Diagnosis not present

## 2016-10-07 DIAGNOSIS — J3081 Allergic rhinitis due to animal (cat) (dog) hair and dander: Secondary | ICD-10-CM | POA: Diagnosis not present

## 2016-10-07 DIAGNOSIS — J3089 Other allergic rhinitis: Secondary | ICD-10-CM | POA: Diagnosis not present

## 2016-10-07 DIAGNOSIS — J301 Allergic rhinitis due to pollen: Secondary | ICD-10-CM | POA: Diagnosis not present

## 2016-10-14 DIAGNOSIS — J3081 Allergic rhinitis due to animal (cat) (dog) hair and dander: Secondary | ICD-10-CM | POA: Diagnosis not present

## 2016-10-14 DIAGNOSIS — J301 Allergic rhinitis due to pollen: Secondary | ICD-10-CM | POA: Diagnosis not present

## 2016-10-14 DIAGNOSIS — J3089 Other allergic rhinitis: Secondary | ICD-10-CM | POA: Diagnosis not present

## 2016-10-22 DIAGNOSIS — J3081 Allergic rhinitis due to animal (cat) (dog) hair and dander: Secondary | ICD-10-CM | POA: Diagnosis not present

## 2016-10-22 DIAGNOSIS — J301 Allergic rhinitis due to pollen: Secondary | ICD-10-CM | POA: Diagnosis not present

## 2016-10-22 DIAGNOSIS — J3089 Other allergic rhinitis: Secondary | ICD-10-CM | POA: Diagnosis not present

## 2016-10-28 DIAGNOSIS — J301 Allergic rhinitis due to pollen: Secondary | ICD-10-CM | POA: Diagnosis not present

## 2016-10-28 DIAGNOSIS — J3081 Allergic rhinitis due to animal (cat) (dog) hair and dander: Secondary | ICD-10-CM | POA: Diagnosis not present

## 2016-10-28 DIAGNOSIS — J3089 Other allergic rhinitis: Secondary | ICD-10-CM | POA: Diagnosis not present

## 2016-11-04 DIAGNOSIS — J3089 Other allergic rhinitis: Secondary | ICD-10-CM | POA: Diagnosis not present

## 2016-11-04 DIAGNOSIS — J301 Allergic rhinitis due to pollen: Secondary | ICD-10-CM | POA: Diagnosis not present

## 2016-11-04 DIAGNOSIS — J3081 Allergic rhinitis due to animal (cat) (dog) hair and dander: Secondary | ICD-10-CM | POA: Diagnosis not present

## 2016-11-11 DIAGNOSIS — J3081 Allergic rhinitis due to animal (cat) (dog) hair and dander: Secondary | ICD-10-CM | POA: Diagnosis not present

## 2016-11-11 DIAGNOSIS — J3089 Other allergic rhinitis: Secondary | ICD-10-CM | POA: Diagnosis not present

## 2016-11-11 DIAGNOSIS — J301 Allergic rhinitis due to pollen: Secondary | ICD-10-CM | POA: Diagnosis not present

## 2016-11-12 IMAGING — CR DG CHEST 2V
2 series · 2 of 2 positions shown · non-contrast
Comparison: None.

CLINICAL DATA: Chest congestion.

EXAM:
CHEST  2 VIEW

[PA]
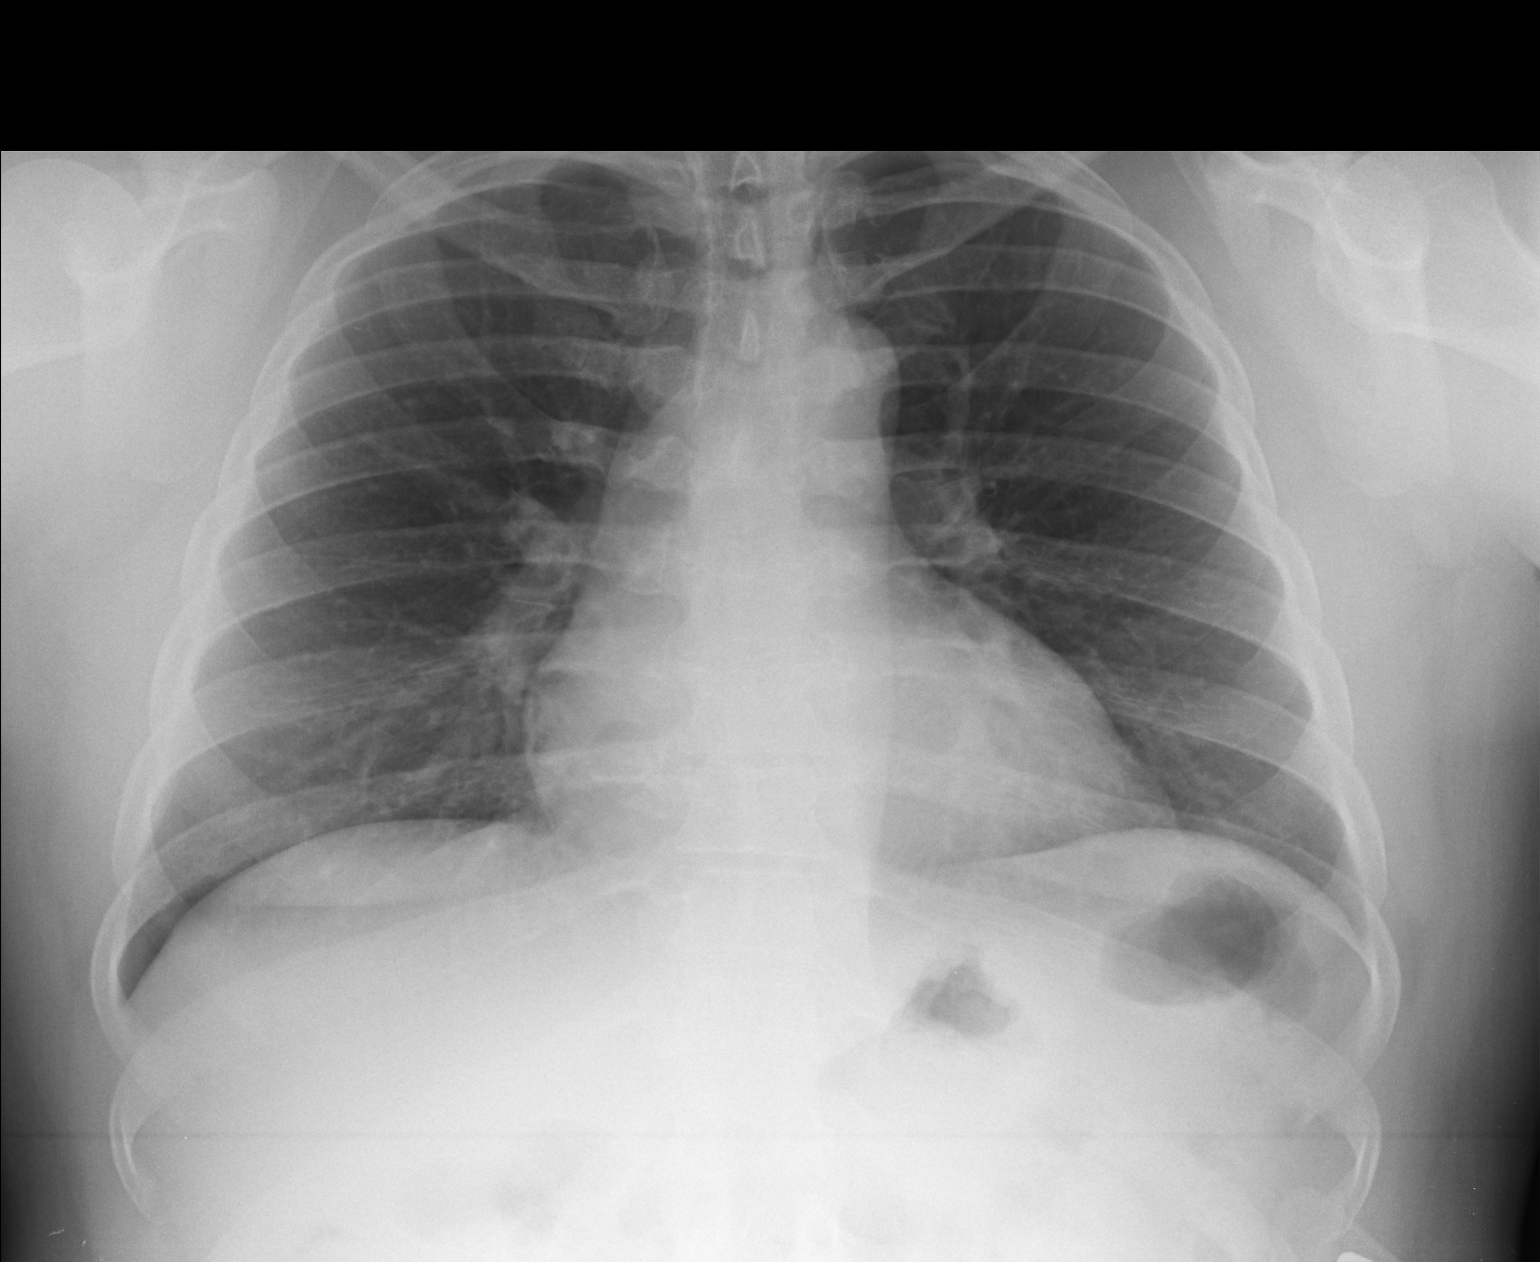

[lateral]
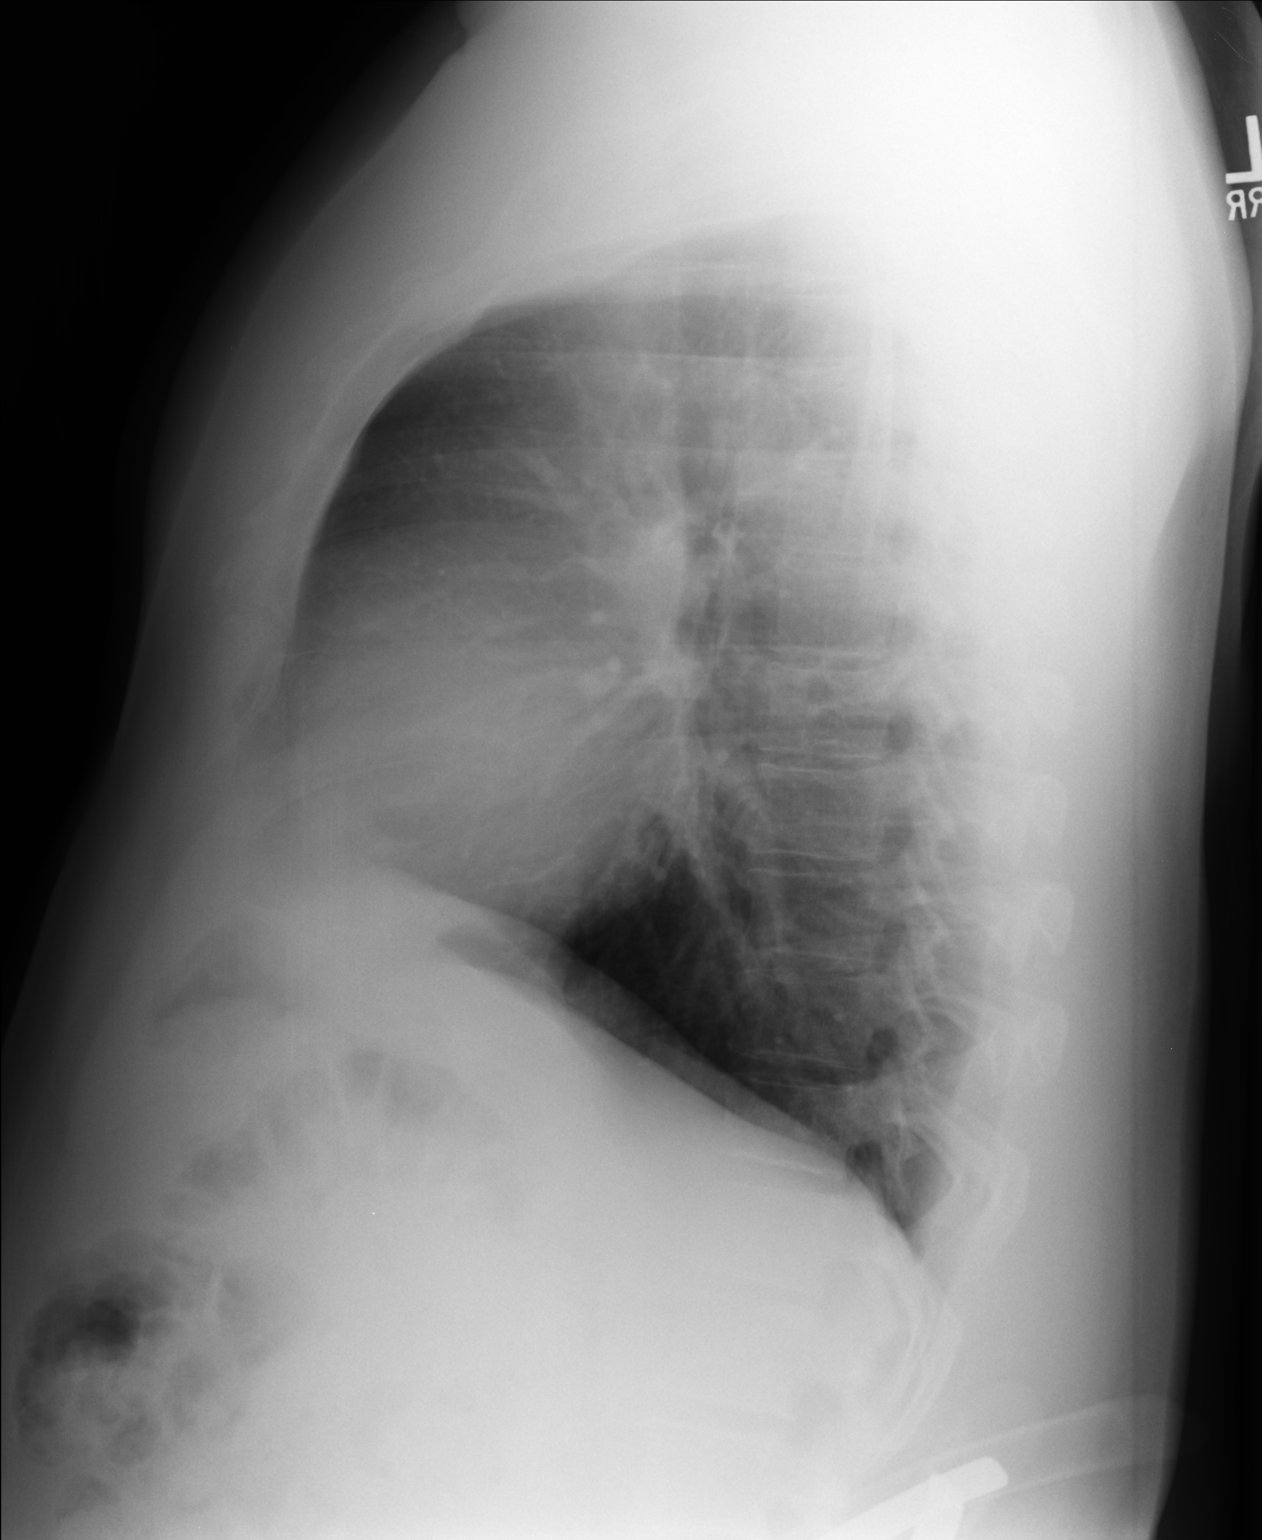

[2 of 2 positions shown; findings below may reference images not displayed]

FINDINGS: The heart size and mediastinal contours are within normal limits.
Both lungs are clear. The visualized skeletal structures are
unremarkable.
IMPRESSION: No active cardiopulmonary disease.

## 2016-11-18 DIAGNOSIS — J301 Allergic rhinitis due to pollen: Secondary | ICD-10-CM | POA: Diagnosis not present

## 2016-11-18 DIAGNOSIS — J3089 Other allergic rhinitis: Secondary | ICD-10-CM | POA: Diagnosis not present

## 2016-11-18 DIAGNOSIS — J3081 Allergic rhinitis due to animal (cat) (dog) hair and dander: Secondary | ICD-10-CM | POA: Diagnosis not present

## 2016-11-26 DIAGNOSIS — J3081 Allergic rhinitis due to animal (cat) (dog) hair and dander: Secondary | ICD-10-CM | POA: Diagnosis not present

## 2016-11-26 DIAGNOSIS — J3089 Other allergic rhinitis: Secondary | ICD-10-CM | POA: Diagnosis not present

## 2016-11-26 DIAGNOSIS — J301 Allergic rhinitis due to pollen: Secondary | ICD-10-CM | POA: Diagnosis not present

## 2016-12-01 DIAGNOSIS — J3089 Other allergic rhinitis: Secondary | ICD-10-CM | POA: Diagnosis not present

## 2016-12-01 DIAGNOSIS — J301 Allergic rhinitis due to pollen: Secondary | ICD-10-CM | POA: Diagnosis not present

## 2016-12-01 DIAGNOSIS — J3081 Allergic rhinitis due to animal (cat) (dog) hair and dander: Secondary | ICD-10-CM | POA: Diagnosis not present

## 2016-12-08 DIAGNOSIS — H1045 Other chronic allergic conjunctivitis: Secondary | ICD-10-CM | POA: Diagnosis not present

## 2016-12-08 DIAGNOSIS — J3089 Other allergic rhinitis: Secondary | ICD-10-CM | POA: Diagnosis not present

## 2016-12-08 DIAGNOSIS — J301 Allergic rhinitis due to pollen: Secondary | ICD-10-CM | POA: Diagnosis not present

## 2016-12-08 DIAGNOSIS — J3081 Allergic rhinitis due to animal (cat) (dog) hair and dander: Secondary | ICD-10-CM | POA: Diagnosis not present

## 2016-12-12 DIAGNOSIS — J3081 Allergic rhinitis due to animal (cat) (dog) hair and dander: Secondary | ICD-10-CM | POA: Diagnosis not present

## 2016-12-12 DIAGNOSIS — J3089 Other allergic rhinitis: Secondary | ICD-10-CM | POA: Diagnosis not present

## 2016-12-17 DIAGNOSIS — J3089 Other allergic rhinitis: Secondary | ICD-10-CM | POA: Diagnosis not present

## 2016-12-17 DIAGNOSIS — J3081 Allergic rhinitis due to animal (cat) (dog) hair and dander: Secondary | ICD-10-CM | POA: Diagnosis not present

## 2016-12-17 DIAGNOSIS — J301 Allergic rhinitis due to pollen: Secondary | ICD-10-CM | POA: Diagnosis not present

## 2016-12-29 DIAGNOSIS — J3089 Other allergic rhinitis: Secondary | ICD-10-CM | POA: Diagnosis not present

## 2016-12-29 DIAGNOSIS — J301 Allergic rhinitis due to pollen: Secondary | ICD-10-CM | POA: Diagnosis not present

## 2016-12-29 DIAGNOSIS — J3081 Allergic rhinitis due to animal (cat) (dog) hair and dander: Secondary | ICD-10-CM | POA: Diagnosis not present

## 2016-12-31 DIAGNOSIS — J3089 Other allergic rhinitis: Secondary | ICD-10-CM | POA: Diagnosis not present

## 2016-12-31 DIAGNOSIS — J301 Allergic rhinitis due to pollen: Secondary | ICD-10-CM | POA: Diagnosis not present

## 2016-12-31 DIAGNOSIS — J3081 Allergic rhinitis due to animal (cat) (dog) hair and dander: Secondary | ICD-10-CM | POA: Diagnosis not present

## 2017-01-02 DIAGNOSIS — J3081 Allergic rhinitis due to animal (cat) (dog) hair and dander: Secondary | ICD-10-CM | POA: Diagnosis not present

## 2017-01-02 DIAGNOSIS — J3089 Other allergic rhinitis: Secondary | ICD-10-CM | POA: Diagnosis not present

## 2017-01-02 DIAGNOSIS — J301 Allergic rhinitis due to pollen: Secondary | ICD-10-CM | POA: Diagnosis not present

## 2017-01-09 DIAGNOSIS — J3081 Allergic rhinitis due to animal (cat) (dog) hair and dander: Secondary | ICD-10-CM | POA: Diagnosis not present

## 2017-01-09 DIAGNOSIS — J301 Allergic rhinitis due to pollen: Secondary | ICD-10-CM | POA: Diagnosis not present

## 2017-01-09 DIAGNOSIS — J3089 Other allergic rhinitis: Secondary | ICD-10-CM | POA: Diagnosis not present

## 2017-01-15 DIAGNOSIS — J3089 Other allergic rhinitis: Secondary | ICD-10-CM | POA: Diagnosis not present

## 2017-01-15 DIAGNOSIS — J301 Allergic rhinitis due to pollen: Secondary | ICD-10-CM | POA: Diagnosis not present

## 2017-01-23 DIAGNOSIS — J3089 Other allergic rhinitis: Secondary | ICD-10-CM | POA: Diagnosis not present

## 2017-01-23 DIAGNOSIS — J3081 Allergic rhinitis due to animal (cat) (dog) hair and dander: Secondary | ICD-10-CM | POA: Diagnosis not present

## 2017-01-23 DIAGNOSIS — J301 Allergic rhinitis due to pollen: Secondary | ICD-10-CM | POA: Diagnosis not present

## 2017-01-27 ENCOUNTER — Other Ambulatory Visit: Payer: Self-pay | Admitting: Urgent Care

## 2017-01-27 NOTE — Telephone Encounter (Signed)
See refill request for Flonase nasal spray;  Last office visit 04/25/15; last refill of Flonase 04/25/15

## 2017-01-28 DIAGNOSIS — J3081 Allergic rhinitis due to animal (cat) (dog) hair and dander: Secondary | ICD-10-CM | POA: Diagnosis not present

## 2017-01-28 DIAGNOSIS — J301 Allergic rhinitis due to pollen: Secondary | ICD-10-CM | POA: Diagnosis not present

## 2017-01-28 DIAGNOSIS — J3089 Other allergic rhinitis: Secondary | ICD-10-CM | POA: Diagnosis not present

## 2017-01-29 ENCOUNTER — Ambulatory Visit: Payer: BLUE CROSS/BLUE SHIELD | Admitting: Family Medicine

## 2017-01-29 ENCOUNTER — Encounter: Payer: Self-pay | Admitting: Family Medicine

## 2017-01-29 ENCOUNTER — Other Ambulatory Visit: Payer: Self-pay

## 2017-01-29 VITALS — BP 120/60 | HR 65 | Temp 98.8°F | Resp 18 | Ht 68.0 in | Wt 252.8 lb

## 2017-01-29 DIAGNOSIS — M62838 Other muscle spasm: Secondary | ICD-10-CM | POA: Diagnosis not present

## 2017-01-29 DIAGNOSIS — R079 Chest pain, unspecified: Secondary | ICD-10-CM | POA: Diagnosis not present

## 2017-01-29 MED ORDER — CYCLOBENZAPRINE HCL 10 MG PO TABS: 10.0000 mg | ORAL_TABLET | Freq: Three times a day (TID) | ORAL | 0 refills | Status: DC | PRN

## 2017-01-29 MED ORDER — IBUPROFEN 600 MG PO TABS: 600.0000 mg | ORAL_TABLET | Freq: Three times a day (TID) | ORAL | 0 refills | Status: DC | PRN

## 2017-01-29 NOTE — Patient Instructions (Addendum)
     IF you received an x-ray today, you will receive an invoice from [facility]. Please contact [facility] at [phone] with questions or concerns regarding your invoice.   IF you received labwork today, you will receive an invoice from [facility]. Please contact [facility] at [phone] with questions or concerns regarding your invoice.   Our billing staff will not be able to assist you with questions regarding bills from these companies.  You will be contacted with the lab results as soon as they are available. The fastest way to get your results is to activate your My Chart account. Instructions are located on the last page of this paperwork. If you have not heard from us regarding the results in 2 weeks, please contact this office.      Chest Wall Pain Chest wall pain is pain in or around the bones and muscles of your chest. Sometimes, an injury causes this pain. Sometimes, the cause may not be known. This pain may take several weeks or longer to get better. Follow these instructions at home: Pay attention to any changes in your symptoms. Take these actions to help with your pain:  Rest as told by your health care provider.  Avoid activities that cause pain. These include any activities that use your chest muscles or your abdominal and side muscles to lift heavy items.  If directed, apply ice to the painful area: ? Put ice in a plastic bag. ? Place a towel between your skin and the bag. ? Leave the ice on for 20 minutes, 2-3 times per day.  Take over-the-counter and prescription medicines only as told by your health care provider.  Do not use tobacco products, including cigarettes, chewing tobacco, and e-cigarettes. If you need help quitting, ask your health care provider.  Keep all follow-up visits as told by your health care provider. This is important.  Contact a health care provider if:  You have a fever.  Your chest pain becomes worse.  You have  new symptoms. Get help right away if:  You have nausea or vomiting.  You feel sweaty or light-headed.  You have a cough with phlegm (sputum) or you cough up blood.  You develop shortness of breath. This information is not intended to replace advice given to you by your health care provider. Make sure you discuss any questions you have with your health care provider. Document Released: 12/30/2004 Document Revised: 05/10/2015 Document Reviewed: 03/27/2014 Elsevier Interactive Patient Education  2018 Elsevier Inc.  

## 2017-01-29 NOTE — Progress Notes (Signed)
   1/17/20195:16 PM  Johnny Barber 1975-12-02, 42 y.o. male [id number]  Chief Complaint  Patient presents with  . Chest Pain    x2 days, pt states he felt chest pain yester morning and this morning. Pt states the pain is on the right side. Pt states he doesn't have tingling in fingers or SOB, and no headache.     HPI:   Patient is a 42 y.o. male with who presents today for 2 days of right sided chest pain, feels at times sharp and others achy, can last for over 30 minutes, not brought up with exertion, aggravated with certain movements. Denies any trauma or injury. Denies any radiation of pain, diaphoresis, nausea, SOB. No fhx early CAD.   [facility] 01/29/2017 04/25/2015  Decreased Interest 0 0  Down, Depressed, Hopeless 0 0  PHQ - 2 Score 0 0    No Known Allergies  Prior to Admission medications   Not on File    Past Medical History:  Diagnosis Date  . Allergy   . Asthma     History reviewed. No pertinent surgical history.  Social History   Tobacco Use  . Smoking status: Never Smoker  . Smokeless tobacco: Never Used  Substance Use Topics  . Alcohol use: Yes    History reviewed. No pertinent family history.  ROS Per hpi  OBJECTIVE:  Blood pressure 120/60, pulse 65, temperature 98.8 F (37.1 C), temperature source Oral, resp. rate 18, height 5\' 8"  (1.727 m), weight 252 lb 12.8 oz (114.7 kg), SpO2 96 %.  Physical Exam  Constitutional: He is oriented to person, place, and time and well-developed, well-nourished, and in no distress.  HENT:  Head: Normocephalic and atraumatic.  Mouth/Throat: Oropharynx is clear and moist.  Eyes: EOM are normal. Pupils are equal, round, and reactive to light.  Neck: Neck supple.  Cardiovascular: Normal rate, regular rhythm, normal heart sounds and intact distal pulses. Exam reveals no gallop and no friction rub.  No murmur heard. Pulmonary/Chest: Effort normal and breath sounds normal. He has no wheezes. He has  no rales. He exhibits tenderness.  Neurological: He is alert and oriented to person, place, and time. Gait normal.  Skin: Skin is warm and dry.     EKG - NSR, HR 59, no ST changes. No q waves  ASSESSMENT and PLAN  1. Chest pain, unspecified type - EKG 12-Lead - unremarkable  2. Muscle spasm Discussed supportive measures, new meds r/se/b and RTC precautions. Patient educational handout given.  Other orders - ibuprofen (ADVIL,MOTRIN) 600 MG tablet; Take 1 tablet (600 mg total) by mouth every 8 (eight) hours as needed. - cyclobenzaprine (FLEXERIL) 10 MG tablet; Take 1 tablet (10 mg total) by mouth 3 (three) times daily as needed for muscle spasms.  Return if symptoms worsen or fail to improve.    Myles LippsIrma M Santiago, MD [facility] [address] [place], [place] [address] Ph.  [phone] Fax [phone]

## 2017-02-03 DIAGNOSIS — J3081 Allergic rhinitis due to animal (cat) (dog) hair and dander: Secondary | ICD-10-CM | POA: Diagnosis not present

## 2017-02-03 DIAGNOSIS — J301 Allergic rhinitis due to pollen: Secondary | ICD-10-CM | POA: Diagnosis not present

## 2017-02-03 DIAGNOSIS — J3089 Other allergic rhinitis: Secondary | ICD-10-CM | POA: Diagnosis not present

## 2017-02-10 DIAGNOSIS — J3089 Other allergic rhinitis: Secondary | ICD-10-CM | POA: Diagnosis not present

## 2017-02-10 DIAGNOSIS — J3081 Allergic rhinitis due to animal (cat) (dog) hair and dander: Secondary | ICD-10-CM | POA: Diagnosis not present

## 2017-02-10 DIAGNOSIS — J301 Allergic rhinitis due to pollen: Secondary | ICD-10-CM | POA: Diagnosis not present

## 2017-02-16 ENCOUNTER — Encounter: Payer: Self-pay | Admitting: Family Medicine

## 2017-02-17 DIAGNOSIS — J301 Allergic rhinitis due to pollen: Secondary | ICD-10-CM | POA: Diagnosis not present

## 2017-02-17 DIAGNOSIS — J3089 Other allergic rhinitis: Secondary | ICD-10-CM | POA: Diagnosis not present

## 2017-02-17 DIAGNOSIS — J3081 Allergic rhinitis due to animal (cat) (dog) hair and dander: Secondary | ICD-10-CM | POA: Diagnosis not present

## 2017-02-24 DIAGNOSIS — J301 Allergic rhinitis due to pollen: Secondary | ICD-10-CM | POA: Diagnosis not present

## 2017-02-24 DIAGNOSIS — J3081 Allergic rhinitis due to animal (cat) (dog) hair and dander: Secondary | ICD-10-CM | POA: Diagnosis not present

## 2017-02-24 DIAGNOSIS — J3089 Other allergic rhinitis: Secondary | ICD-10-CM | POA: Diagnosis not present

## 2017-03-04 DIAGNOSIS — J3089 Other allergic rhinitis: Secondary | ICD-10-CM | POA: Diagnosis not present

## 2017-03-04 DIAGNOSIS — J301 Allergic rhinitis due to pollen: Secondary | ICD-10-CM | POA: Diagnosis not present

## 2017-03-04 DIAGNOSIS — J3081 Allergic rhinitis due to animal (cat) (dog) hair and dander: Secondary | ICD-10-CM | POA: Diagnosis not present

## 2017-03-11 DIAGNOSIS — J3089 Other allergic rhinitis: Secondary | ICD-10-CM | POA: Diagnosis not present

## 2017-03-11 DIAGNOSIS — J301 Allergic rhinitis due to pollen: Secondary | ICD-10-CM | POA: Diagnosis not present

## 2017-03-18 DIAGNOSIS — J301 Allergic rhinitis due to pollen: Secondary | ICD-10-CM | POA: Diagnosis not present

## 2017-03-18 DIAGNOSIS — J3089 Other allergic rhinitis: Secondary | ICD-10-CM | POA: Diagnosis not present

## 2017-03-18 DIAGNOSIS — J3081 Allergic rhinitis due to animal (cat) (dog) hair and dander: Secondary | ICD-10-CM | POA: Diagnosis not present

## 2017-03-25 DIAGNOSIS — J301 Allergic rhinitis due to pollen: Secondary | ICD-10-CM | POA: Diagnosis not present

## 2017-03-25 DIAGNOSIS — J3081 Allergic rhinitis due to animal (cat) (dog) hair and dander: Secondary | ICD-10-CM | POA: Diagnosis not present

## 2017-03-25 DIAGNOSIS — J3089 Other allergic rhinitis: Secondary | ICD-10-CM | POA: Diagnosis not present

## 2017-04-01 DIAGNOSIS — J301 Allergic rhinitis due to pollen: Secondary | ICD-10-CM | POA: Diagnosis not present

## 2017-04-01 DIAGNOSIS — J3081 Allergic rhinitis due to animal (cat) (dog) hair and dander: Secondary | ICD-10-CM | POA: Diagnosis not present

## 2017-04-01 DIAGNOSIS — J3089 Other allergic rhinitis: Secondary | ICD-10-CM | POA: Diagnosis not present

## 2017-04-08 DIAGNOSIS — J301 Allergic rhinitis due to pollen: Secondary | ICD-10-CM | POA: Diagnosis not present

## 2017-04-08 DIAGNOSIS — J3089 Other allergic rhinitis: Secondary | ICD-10-CM | POA: Diagnosis not present

## 2017-04-08 DIAGNOSIS — J3081 Allergic rhinitis due to animal (cat) (dog) hair and dander: Secondary | ICD-10-CM | POA: Diagnosis not present

## 2017-04-14 DIAGNOSIS — J3089 Other allergic rhinitis: Secondary | ICD-10-CM | POA: Diagnosis not present

## 2017-04-14 DIAGNOSIS — J301 Allergic rhinitis due to pollen: Secondary | ICD-10-CM | POA: Diagnosis not present

## 2017-04-14 DIAGNOSIS — J3081 Allergic rhinitis due to animal (cat) (dog) hair and dander: Secondary | ICD-10-CM | POA: Diagnosis not present

## 2017-04-22 DIAGNOSIS — J3081 Allergic rhinitis due to animal (cat) (dog) hair and dander: Secondary | ICD-10-CM | POA: Diagnosis not present

## 2017-04-22 DIAGNOSIS — J301 Allergic rhinitis due to pollen: Secondary | ICD-10-CM | POA: Diagnosis not present

## 2017-04-22 DIAGNOSIS — J3089 Other allergic rhinitis: Secondary | ICD-10-CM | POA: Diagnosis not present

## 2017-04-29 DIAGNOSIS — J301 Allergic rhinitis due to pollen: Secondary | ICD-10-CM | POA: Diagnosis not present

## 2017-04-29 DIAGNOSIS — J3089 Other allergic rhinitis: Secondary | ICD-10-CM | POA: Diagnosis not present

## 2017-04-29 DIAGNOSIS — J3081 Allergic rhinitis due to animal (cat) (dog) hair and dander: Secondary | ICD-10-CM | POA: Diagnosis not present

## 2017-05-05 DIAGNOSIS — J3089 Other allergic rhinitis: Secondary | ICD-10-CM | POA: Diagnosis not present

## 2017-05-05 DIAGNOSIS — J301 Allergic rhinitis due to pollen: Secondary | ICD-10-CM | POA: Diagnosis not present

## 2017-05-05 DIAGNOSIS — J3081 Allergic rhinitis due to animal (cat) (dog) hair and dander: Secondary | ICD-10-CM | POA: Diagnosis not present

## 2017-05-12 DIAGNOSIS — J3089 Other allergic rhinitis: Secondary | ICD-10-CM | POA: Diagnosis not present

## 2017-05-12 DIAGNOSIS — J301 Allergic rhinitis due to pollen: Secondary | ICD-10-CM | POA: Diagnosis not present

## 2017-05-12 DIAGNOSIS — J3081 Allergic rhinitis due to animal (cat) (dog) hair and dander: Secondary | ICD-10-CM | POA: Diagnosis not present

## 2017-05-19 DIAGNOSIS — J3089 Other allergic rhinitis: Secondary | ICD-10-CM | POA: Diagnosis not present

## 2017-05-19 DIAGNOSIS — J3081 Allergic rhinitis due to animal (cat) (dog) hair and dander: Secondary | ICD-10-CM | POA: Diagnosis not present

## 2017-05-19 DIAGNOSIS — J301 Allergic rhinitis due to pollen: Secondary | ICD-10-CM | POA: Diagnosis not present

## 2017-05-20 DIAGNOSIS — J301 Allergic rhinitis due to pollen: Secondary | ICD-10-CM | POA: Diagnosis not present

## 2017-05-21 DIAGNOSIS — J3081 Allergic rhinitis due to animal (cat) (dog) hair and dander: Secondary | ICD-10-CM | POA: Diagnosis not present

## 2017-05-21 DIAGNOSIS — J3089 Other allergic rhinitis: Secondary | ICD-10-CM | POA: Diagnosis not present

## 2017-05-26 DIAGNOSIS — J3081 Allergic rhinitis due to animal (cat) (dog) hair and dander: Secondary | ICD-10-CM | POA: Diagnosis not present

## 2017-05-26 DIAGNOSIS — J3089 Other allergic rhinitis: Secondary | ICD-10-CM | POA: Diagnosis not present

## 2017-05-26 DIAGNOSIS — J301 Allergic rhinitis due to pollen: Secondary | ICD-10-CM | POA: Diagnosis not present

## 2017-06-02 DIAGNOSIS — J3089 Other allergic rhinitis: Secondary | ICD-10-CM | POA: Diagnosis not present

## 2017-06-02 DIAGNOSIS — J3081 Allergic rhinitis due to animal (cat) (dog) hair and dander: Secondary | ICD-10-CM | POA: Diagnosis not present

## 2017-06-02 DIAGNOSIS — J301 Allergic rhinitis due to pollen: Secondary | ICD-10-CM | POA: Diagnosis not present

## 2017-06-09 DIAGNOSIS — J3089 Other allergic rhinitis: Secondary | ICD-10-CM | POA: Diagnosis not present

## 2017-06-09 DIAGNOSIS — J3081 Allergic rhinitis due to animal (cat) (dog) hair and dander: Secondary | ICD-10-CM | POA: Diagnosis not present

## 2017-06-09 DIAGNOSIS — J301 Allergic rhinitis due to pollen: Secondary | ICD-10-CM | POA: Diagnosis not present

## 2017-06-16 DIAGNOSIS — J301 Allergic rhinitis due to pollen: Secondary | ICD-10-CM | POA: Diagnosis not present

## 2017-06-16 DIAGNOSIS — J3081 Allergic rhinitis due to animal (cat) (dog) hair and dander: Secondary | ICD-10-CM | POA: Diagnosis not present

## 2017-06-16 DIAGNOSIS — J3089 Other allergic rhinitis: Secondary | ICD-10-CM | POA: Diagnosis not present

## 2017-06-23 DIAGNOSIS — J3089 Other allergic rhinitis: Secondary | ICD-10-CM | POA: Diagnosis not present

## 2017-06-23 DIAGNOSIS — J301 Allergic rhinitis due to pollen: Secondary | ICD-10-CM | POA: Diagnosis not present

## 2017-06-23 DIAGNOSIS — J3081 Allergic rhinitis due to animal (cat) (dog) hair and dander: Secondary | ICD-10-CM | POA: Diagnosis not present

## 2017-06-30 DIAGNOSIS — J3081 Allergic rhinitis due to animal (cat) (dog) hair and dander: Secondary | ICD-10-CM | POA: Diagnosis not present

## 2017-06-30 DIAGNOSIS — J3089 Other allergic rhinitis: Secondary | ICD-10-CM | POA: Diagnosis not present

## 2017-06-30 DIAGNOSIS — J301 Allergic rhinitis due to pollen: Secondary | ICD-10-CM | POA: Diagnosis not present

## 2017-07-08 DIAGNOSIS — J3081 Allergic rhinitis due to animal (cat) (dog) hair and dander: Secondary | ICD-10-CM | POA: Diagnosis not present

## 2017-07-08 DIAGNOSIS — J3089 Other allergic rhinitis: Secondary | ICD-10-CM | POA: Diagnosis not present

## 2017-07-08 DIAGNOSIS — J301 Allergic rhinitis due to pollen: Secondary | ICD-10-CM | POA: Diagnosis not present

## 2017-07-15 DIAGNOSIS — J301 Allergic rhinitis due to pollen: Secondary | ICD-10-CM | POA: Diagnosis not present

## 2017-07-15 DIAGNOSIS — J3081 Allergic rhinitis due to animal (cat) (dog) hair and dander: Secondary | ICD-10-CM | POA: Diagnosis not present

## 2017-07-15 DIAGNOSIS — J3089 Other allergic rhinitis: Secondary | ICD-10-CM | POA: Diagnosis not present

## 2017-07-21 DIAGNOSIS — J3081 Allergic rhinitis due to animal (cat) (dog) hair and dander: Secondary | ICD-10-CM | POA: Diagnosis not present

## 2017-07-21 DIAGNOSIS — J3089 Other allergic rhinitis: Secondary | ICD-10-CM | POA: Diagnosis not present

## 2017-07-21 DIAGNOSIS — J301 Allergic rhinitis due to pollen: Secondary | ICD-10-CM | POA: Diagnosis not present

## 2017-07-28 DIAGNOSIS — J3081 Allergic rhinitis due to animal (cat) (dog) hair and dander: Secondary | ICD-10-CM | POA: Diagnosis not present

## 2017-07-28 DIAGNOSIS — J3089 Other allergic rhinitis: Secondary | ICD-10-CM | POA: Diagnosis not present

## 2017-07-28 DIAGNOSIS — J301 Allergic rhinitis due to pollen: Secondary | ICD-10-CM | POA: Diagnosis not present

## 2017-08-04 DIAGNOSIS — J3081 Allergic rhinitis due to animal (cat) (dog) hair and dander: Secondary | ICD-10-CM | POA: Diagnosis not present

## 2017-08-04 DIAGNOSIS — J301 Allergic rhinitis due to pollen: Secondary | ICD-10-CM | POA: Diagnosis not present

## 2017-08-04 DIAGNOSIS — J3089 Other allergic rhinitis: Secondary | ICD-10-CM | POA: Diagnosis not present

## 2017-08-11 DIAGNOSIS — J3081 Allergic rhinitis due to animal (cat) (dog) hair and dander: Secondary | ICD-10-CM | POA: Diagnosis not present

## 2017-08-11 DIAGNOSIS — J301 Allergic rhinitis due to pollen: Secondary | ICD-10-CM | POA: Diagnosis not present

## 2017-08-11 DIAGNOSIS — J3089 Other allergic rhinitis: Secondary | ICD-10-CM | POA: Diagnosis not present

## 2017-08-18 DIAGNOSIS — J3081 Allergic rhinitis due to animal (cat) (dog) hair and dander: Secondary | ICD-10-CM | POA: Diagnosis not present

## 2017-08-18 DIAGNOSIS — J3089 Other allergic rhinitis: Secondary | ICD-10-CM | POA: Diagnosis not present

## 2017-08-18 DIAGNOSIS — J301 Allergic rhinitis due to pollen: Secondary | ICD-10-CM | POA: Diagnosis not present

## 2017-08-25 DIAGNOSIS — J3081 Allergic rhinitis due to animal (cat) (dog) hair and dander: Secondary | ICD-10-CM | POA: Diagnosis not present

## 2017-08-25 DIAGNOSIS — J301 Allergic rhinitis due to pollen: Secondary | ICD-10-CM | POA: Diagnosis not present

## 2017-08-25 DIAGNOSIS — J3089 Other allergic rhinitis: Secondary | ICD-10-CM | POA: Diagnosis not present

## 2017-09-01 DIAGNOSIS — J301 Allergic rhinitis due to pollen: Secondary | ICD-10-CM | POA: Diagnosis not present

## 2017-09-01 DIAGNOSIS — J3081 Allergic rhinitis due to animal (cat) (dog) hair and dander: Secondary | ICD-10-CM | POA: Diagnosis not present

## 2017-09-01 DIAGNOSIS — J3089 Other allergic rhinitis: Secondary | ICD-10-CM | POA: Diagnosis not present

## 2017-09-08 DIAGNOSIS — J301 Allergic rhinitis due to pollen: Secondary | ICD-10-CM | POA: Diagnosis not present

## 2017-09-08 DIAGNOSIS — J3081 Allergic rhinitis due to animal (cat) (dog) hair and dander: Secondary | ICD-10-CM | POA: Diagnosis not present

## 2017-09-08 DIAGNOSIS — J3089 Other allergic rhinitis: Secondary | ICD-10-CM | POA: Diagnosis not present

## 2017-09-16 DIAGNOSIS — J301 Allergic rhinitis due to pollen: Secondary | ICD-10-CM | POA: Diagnosis not present

## 2017-09-16 DIAGNOSIS — J3081 Allergic rhinitis due to animal (cat) (dog) hair and dander: Secondary | ICD-10-CM | POA: Diagnosis not present

## 2017-09-16 DIAGNOSIS — J3089 Other allergic rhinitis: Secondary | ICD-10-CM | POA: Diagnosis not present

## 2017-09-21 DIAGNOSIS — J301 Allergic rhinitis due to pollen: Secondary | ICD-10-CM | POA: Diagnosis not present

## 2017-09-21 DIAGNOSIS — J3081 Allergic rhinitis due to animal (cat) (dog) hair and dander: Secondary | ICD-10-CM | POA: Diagnosis not present

## 2017-09-21 DIAGNOSIS — J3089 Other allergic rhinitis: Secondary | ICD-10-CM | POA: Diagnosis not present

## 2017-09-29 DIAGNOSIS — J301 Allergic rhinitis due to pollen: Secondary | ICD-10-CM | POA: Diagnosis not present

## 2017-09-29 DIAGNOSIS — J3089 Other allergic rhinitis: Secondary | ICD-10-CM | POA: Diagnosis not present

## 2017-09-29 DIAGNOSIS — J3081 Allergic rhinitis due to animal (cat) (dog) hair and dander: Secondary | ICD-10-CM | POA: Diagnosis not present

## 2017-10-05 DIAGNOSIS — J3089 Other allergic rhinitis: Secondary | ICD-10-CM | POA: Diagnosis not present

## 2017-10-05 DIAGNOSIS — J301 Allergic rhinitis due to pollen: Secondary | ICD-10-CM | POA: Diagnosis not present

## 2017-10-05 DIAGNOSIS — J3081 Allergic rhinitis due to animal (cat) (dog) hair and dander: Secondary | ICD-10-CM | POA: Diagnosis not present

## 2017-10-13 DIAGNOSIS — J301 Allergic rhinitis due to pollen: Secondary | ICD-10-CM | POA: Diagnosis not present

## 2017-10-13 DIAGNOSIS — J3089 Other allergic rhinitis: Secondary | ICD-10-CM | POA: Diagnosis not present

## 2017-10-13 DIAGNOSIS — J3081 Allergic rhinitis due to animal (cat) (dog) hair and dander: Secondary | ICD-10-CM | POA: Diagnosis not present

## 2017-10-19 DIAGNOSIS — J3081 Allergic rhinitis due to animal (cat) (dog) hair and dander: Secondary | ICD-10-CM | POA: Diagnosis not present

## 2017-10-19 DIAGNOSIS — J3089 Other allergic rhinitis: Secondary | ICD-10-CM | POA: Diagnosis not present

## 2017-10-19 DIAGNOSIS — J301 Allergic rhinitis due to pollen: Secondary | ICD-10-CM | POA: Diagnosis not present

## 2017-10-23 DIAGNOSIS — J3081 Allergic rhinitis due to animal (cat) (dog) hair and dander: Secondary | ICD-10-CM | POA: Diagnosis not present

## 2017-10-23 DIAGNOSIS — J301 Allergic rhinitis due to pollen: Secondary | ICD-10-CM | POA: Diagnosis not present

## 2017-10-23 DIAGNOSIS — J3089 Other allergic rhinitis: Secondary | ICD-10-CM | POA: Diagnosis not present

## 2017-10-26 DIAGNOSIS — J3089 Other allergic rhinitis: Secondary | ICD-10-CM | POA: Diagnosis not present

## 2017-10-26 DIAGNOSIS — J3081 Allergic rhinitis due to animal (cat) (dog) hair and dander: Secondary | ICD-10-CM | POA: Diagnosis not present

## 2017-10-26 DIAGNOSIS — J301 Allergic rhinitis due to pollen: Secondary | ICD-10-CM | POA: Diagnosis not present

## 2017-11-03 DIAGNOSIS — J3081 Allergic rhinitis due to animal (cat) (dog) hair and dander: Secondary | ICD-10-CM | POA: Diagnosis not present

## 2017-11-03 DIAGNOSIS — J301 Allergic rhinitis due to pollen: Secondary | ICD-10-CM | POA: Diagnosis not present

## 2017-11-03 DIAGNOSIS — J3089 Other allergic rhinitis: Secondary | ICD-10-CM | POA: Diagnosis not present

## 2017-11-09 DIAGNOSIS — J301 Allergic rhinitis due to pollen: Secondary | ICD-10-CM | POA: Diagnosis not present

## 2017-11-09 DIAGNOSIS — J3081 Allergic rhinitis due to animal (cat) (dog) hair and dander: Secondary | ICD-10-CM | POA: Diagnosis not present

## 2017-11-09 DIAGNOSIS — J3089 Other allergic rhinitis: Secondary | ICD-10-CM | POA: Diagnosis not present

## 2017-11-16 DIAGNOSIS — J3081 Allergic rhinitis due to animal (cat) (dog) hair and dander: Secondary | ICD-10-CM | POA: Diagnosis not present

## 2017-11-16 DIAGNOSIS — J301 Allergic rhinitis due to pollen: Secondary | ICD-10-CM | POA: Diagnosis not present

## 2017-11-16 DIAGNOSIS — J3089 Other allergic rhinitis: Secondary | ICD-10-CM | POA: Diagnosis not present

## 2017-11-18 DIAGNOSIS — J3081 Allergic rhinitis due to animal (cat) (dog) hair and dander: Secondary | ICD-10-CM | POA: Diagnosis not present

## 2017-11-18 DIAGNOSIS — J3089 Other allergic rhinitis: Secondary | ICD-10-CM | POA: Diagnosis not present

## 2017-11-18 DIAGNOSIS — J301 Allergic rhinitis due to pollen: Secondary | ICD-10-CM | POA: Diagnosis not present

## 2017-11-23 DIAGNOSIS — J301 Allergic rhinitis due to pollen: Secondary | ICD-10-CM | POA: Diagnosis not present

## 2017-11-23 DIAGNOSIS — J3081 Allergic rhinitis due to animal (cat) (dog) hair and dander: Secondary | ICD-10-CM | POA: Diagnosis not present

## 2017-11-23 DIAGNOSIS — J3089 Other allergic rhinitis: Secondary | ICD-10-CM | POA: Diagnosis not present

## 2017-11-25 DIAGNOSIS — J3089 Other allergic rhinitis: Secondary | ICD-10-CM | POA: Diagnosis not present

## 2017-11-25 DIAGNOSIS — J3081 Allergic rhinitis due to animal (cat) (dog) hair and dander: Secondary | ICD-10-CM | POA: Diagnosis not present

## 2017-11-25 DIAGNOSIS — J301 Allergic rhinitis due to pollen: Secondary | ICD-10-CM | POA: Diagnosis not present

## 2017-12-01 DIAGNOSIS — J3081 Allergic rhinitis due to animal (cat) (dog) hair and dander: Secondary | ICD-10-CM | POA: Diagnosis not present

## 2017-12-01 DIAGNOSIS — J3089 Other allergic rhinitis: Secondary | ICD-10-CM | POA: Diagnosis not present

## 2017-12-01 DIAGNOSIS — J301 Allergic rhinitis due to pollen: Secondary | ICD-10-CM | POA: Diagnosis not present

## 2017-12-03 DIAGNOSIS — J301 Allergic rhinitis due to pollen: Secondary | ICD-10-CM | POA: Diagnosis not present

## 2017-12-07 DIAGNOSIS — J3081 Allergic rhinitis due to animal (cat) (dog) hair and dander: Secondary | ICD-10-CM | POA: Diagnosis not present

## 2017-12-07 DIAGNOSIS — J3089 Other allergic rhinitis: Secondary | ICD-10-CM | POA: Diagnosis not present

## 2017-12-07 DIAGNOSIS — J301 Allergic rhinitis due to pollen: Secondary | ICD-10-CM | POA: Diagnosis not present

## 2017-12-14 DIAGNOSIS — J3081 Allergic rhinitis due to animal (cat) (dog) hair and dander: Secondary | ICD-10-CM | POA: Diagnosis not present

## 2017-12-14 DIAGNOSIS — J301 Allergic rhinitis due to pollen: Secondary | ICD-10-CM | POA: Diagnosis not present

## 2017-12-14 DIAGNOSIS — J3089 Other allergic rhinitis: Secondary | ICD-10-CM | POA: Diagnosis not present

## 2017-12-22 DIAGNOSIS — J301 Allergic rhinitis due to pollen: Secondary | ICD-10-CM | POA: Diagnosis not present

## 2017-12-22 DIAGNOSIS — J3081 Allergic rhinitis due to animal (cat) (dog) hair and dander: Secondary | ICD-10-CM | POA: Diagnosis not present

## 2017-12-22 DIAGNOSIS — J3089 Other allergic rhinitis: Secondary | ICD-10-CM | POA: Diagnosis not present

## 2017-12-24 DIAGNOSIS — J301 Allergic rhinitis due to pollen: Secondary | ICD-10-CM | POA: Diagnosis not present

## 2017-12-25 DIAGNOSIS — J3081 Allergic rhinitis due to animal (cat) (dog) hair and dander: Secondary | ICD-10-CM | POA: Diagnosis not present

## 2017-12-25 DIAGNOSIS — J3089 Other allergic rhinitis: Secondary | ICD-10-CM | POA: Diagnosis not present

## 2017-12-28 DIAGNOSIS — J301 Allergic rhinitis due to pollen: Secondary | ICD-10-CM | POA: Diagnosis not present

## 2017-12-28 DIAGNOSIS — J3081 Allergic rhinitis due to animal (cat) (dog) hair and dander: Secondary | ICD-10-CM | POA: Diagnosis not present

## 2017-12-28 DIAGNOSIS — J3089 Other allergic rhinitis: Secondary | ICD-10-CM | POA: Diagnosis not present

## 2018-01-04 DIAGNOSIS — J3081 Allergic rhinitis due to animal (cat) (dog) hair and dander: Secondary | ICD-10-CM | POA: Diagnosis not present

## 2018-01-04 DIAGNOSIS — J301 Allergic rhinitis due to pollen: Secondary | ICD-10-CM | POA: Diagnosis not present

## 2018-01-04 DIAGNOSIS — J3089 Other allergic rhinitis: Secondary | ICD-10-CM | POA: Diagnosis not present

## 2018-01-11 DIAGNOSIS — J3089 Other allergic rhinitis: Secondary | ICD-10-CM | POA: Diagnosis not present

## 2018-01-11 DIAGNOSIS — J301 Allergic rhinitis due to pollen: Secondary | ICD-10-CM | POA: Diagnosis not present

## 2018-01-11 DIAGNOSIS — J3081 Allergic rhinitis due to animal (cat) (dog) hair and dander: Secondary | ICD-10-CM | POA: Diagnosis not present

## 2018-01-19 DIAGNOSIS — J3089 Other allergic rhinitis: Secondary | ICD-10-CM | POA: Diagnosis not present

## 2018-01-19 DIAGNOSIS — J301 Allergic rhinitis due to pollen: Secondary | ICD-10-CM | POA: Diagnosis not present

## 2018-01-19 DIAGNOSIS — J3081 Allergic rhinitis due to animal (cat) (dog) hair and dander: Secondary | ICD-10-CM | POA: Diagnosis not present

## 2018-01-25 DIAGNOSIS — J3089 Other allergic rhinitis: Secondary | ICD-10-CM | POA: Diagnosis not present

## 2018-01-25 DIAGNOSIS — J3081 Allergic rhinitis due to animal (cat) (dog) hair and dander: Secondary | ICD-10-CM | POA: Diagnosis not present

## 2018-01-25 DIAGNOSIS — J301 Allergic rhinitis due to pollen: Secondary | ICD-10-CM | POA: Diagnosis not present

## 2018-01-28 DIAGNOSIS — J301 Allergic rhinitis due to pollen: Secondary | ICD-10-CM | POA: Diagnosis not present

## 2018-01-28 DIAGNOSIS — J3081 Allergic rhinitis due to animal (cat) (dog) hair and dander: Secondary | ICD-10-CM | POA: Diagnosis not present

## 2018-01-28 DIAGNOSIS — J3089 Other allergic rhinitis: Secondary | ICD-10-CM | POA: Diagnosis not present

## 2018-02-01 DIAGNOSIS — J3089 Other allergic rhinitis: Secondary | ICD-10-CM | POA: Diagnosis not present

## 2018-02-01 DIAGNOSIS — J301 Allergic rhinitis due to pollen: Secondary | ICD-10-CM | POA: Diagnosis not present

## 2018-02-01 DIAGNOSIS — J3081 Allergic rhinitis due to animal (cat) (dog) hair and dander: Secondary | ICD-10-CM | POA: Diagnosis not present

## 2018-02-03 DIAGNOSIS — J3089 Other allergic rhinitis: Secondary | ICD-10-CM | POA: Diagnosis not present

## 2018-02-03 DIAGNOSIS — J3081 Allergic rhinitis due to animal (cat) (dog) hair and dander: Secondary | ICD-10-CM | POA: Diagnosis not present

## 2018-02-03 DIAGNOSIS — J301 Allergic rhinitis due to pollen: Secondary | ICD-10-CM | POA: Diagnosis not present

## 2018-02-08 DIAGNOSIS — J3081 Allergic rhinitis due to animal (cat) (dog) hair and dander: Secondary | ICD-10-CM | POA: Diagnosis not present

## 2018-02-08 DIAGNOSIS — J3089 Other allergic rhinitis: Secondary | ICD-10-CM | POA: Diagnosis not present

## 2018-02-08 DIAGNOSIS — J301 Allergic rhinitis due to pollen: Secondary | ICD-10-CM | POA: Diagnosis not present

## 2018-02-15 DIAGNOSIS — J3089 Other allergic rhinitis: Secondary | ICD-10-CM | POA: Diagnosis not present

## 2018-02-15 DIAGNOSIS — J301 Allergic rhinitis due to pollen: Secondary | ICD-10-CM | POA: Diagnosis not present

## 2018-02-15 DIAGNOSIS — J3081 Allergic rhinitis due to animal (cat) (dog) hair and dander: Secondary | ICD-10-CM | POA: Diagnosis not present

## 2018-02-22 DIAGNOSIS — J3089 Other allergic rhinitis: Secondary | ICD-10-CM | POA: Diagnosis not present

## 2018-02-22 DIAGNOSIS — J3081 Allergic rhinitis due to animal (cat) (dog) hair and dander: Secondary | ICD-10-CM | POA: Diagnosis not present

## 2018-02-22 DIAGNOSIS — J301 Allergic rhinitis due to pollen: Secondary | ICD-10-CM | POA: Diagnosis not present

## 2018-03-02 DIAGNOSIS — J3081 Allergic rhinitis due to animal (cat) (dog) hair and dander: Secondary | ICD-10-CM | POA: Diagnosis not present

## 2018-03-02 DIAGNOSIS — J3089 Other allergic rhinitis: Secondary | ICD-10-CM | POA: Diagnosis not present

## 2018-03-02 DIAGNOSIS — J301 Allergic rhinitis due to pollen: Secondary | ICD-10-CM | POA: Diagnosis not present

## 2018-03-08 DIAGNOSIS — J301 Allergic rhinitis due to pollen: Secondary | ICD-10-CM | POA: Diagnosis not present

## 2018-03-08 DIAGNOSIS — J3081 Allergic rhinitis due to animal (cat) (dog) hair and dander: Secondary | ICD-10-CM | POA: Diagnosis not present

## 2018-03-08 DIAGNOSIS — J3089 Other allergic rhinitis: Secondary | ICD-10-CM | POA: Diagnosis not present

## 2018-03-15 DIAGNOSIS — J3081 Allergic rhinitis due to animal (cat) (dog) hair and dander: Secondary | ICD-10-CM | POA: Diagnosis not present

## 2018-03-15 DIAGNOSIS — J301 Allergic rhinitis due to pollen: Secondary | ICD-10-CM | POA: Diagnosis not present

## 2018-03-15 DIAGNOSIS — J3089 Other allergic rhinitis: Secondary | ICD-10-CM | POA: Diagnosis not present

## 2018-03-22 DIAGNOSIS — J301 Allergic rhinitis due to pollen: Secondary | ICD-10-CM | POA: Diagnosis not present

## 2018-03-22 DIAGNOSIS — J3081 Allergic rhinitis due to animal (cat) (dog) hair and dander: Secondary | ICD-10-CM | POA: Diagnosis not present

## 2018-03-22 DIAGNOSIS — J3089 Other allergic rhinitis: Secondary | ICD-10-CM | POA: Diagnosis not present

## 2018-03-29 DIAGNOSIS — J301 Allergic rhinitis due to pollen: Secondary | ICD-10-CM | POA: Diagnosis not present

## 2018-03-29 DIAGNOSIS — J3089 Other allergic rhinitis: Secondary | ICD-10-CM | POA: Diagnosis not present

## 2018-04-06 DIAGNOSIS — J301 Allergic rhinitis due to pollen: Secondary | ICD-10-CM | POA: Diagnosis not present

## 2018-04-06 DIAGNOSIS — J3089 Other allergic rhinitis: Secondary | ICD-10-CM | POA: Diagnosis not present

## 2018-04-06 DIAGNOSIS — J3081 Allergic rhinitis due to animal (cat) (dog) hair and dander: Secondary | ICD-10-CM | POA: Diagnosis not present

## 2018-04-12 DIAGNOSIS — J3081 Allergic rhinitis due to animal (cat) (dog) hair and dander: Secondary | ICD-10-CM | POA: Diagnosis not present

## 2018-04-12 DIAGNOSIS — J301 Allergic rhinitis due to pollen: Secondary | ICD-10-CM | POA: Diagnosis not present

## 2018-04-12 DIAGNOSIS — J3089 Other allergic rhinitis: Secondary | ICD-10-CM | POA: Diagnosis not present

## 2018-04-19 DIAGNOSIS — J3089 Other allergic rhinitis: Secondary | ICD-10-CM | POA: Diagnosis not present

## 2018-04-19 DIAGNOSIS — J3081 Allergic rhinitis due to animal (cat) (dog) hair and dander: Secondary | ICD-10-CM | POA: Diagnosis not present

## 2018-04-19 DIAGNOSIS — J301 Allergic rhinitis due to pollen: Secondary | ICD-10-CM | POA: Diagnosis not present

## 2018-04-26 DIAGNOSIS — J3089 Other allergic rhinitis: Secondary | ICD-10-CM | POA: Diagnosis not present

## 2018-04-26 DIAGNOSIS — J301 Allergic rhinitis due to pollen: Secondary | ICD-10-CM | POA: Diagnosis not present

## 2018-04-26 DIAGNOSIS — J3081 Allergic rhinitis due to animal (cat) (dog) hair and dander: Secondary | ICD-10-CM | POA: Diagnosis not present

## 2018-05-03 DIAGNOSIS — J301 Allergic rhinitis due to pollen: Secondary | ICD-10-CM | POA: Diagnosis not present

## 2018-05-03 DIAGNOSIS — J3089 Other allergic rhinitis: Secondary | ICD-10-CM | POA: Diagnosis not present

## 2018-05-03 DIAGNOSIS — J3081 Allergic rhinitis due to animal (cat) (dog) hair and dander: Secondary | ICD-10-CM | POA: Diagnosis not present

## 2018-05-10 DIAGNOSIS — J3089 Other allergic rhinitis: Secondary | ICD-10-CM | POA: Diagnosis not present

## 2018-05-10 DIAGNOSIS — J3081 Allergic rhinitis due to animal (cat) (dog) hair and dander: Secondary | ICD-10-CM | POA: Diagnosis not present

## 2018-05-10 DIAGNOSIS — J301 Allergic rhinitis due to pollen: Secondary | ICD-10-CM | POA: Diagnosis not present

## 2018-05-17 DIAGNOSIS — J3081 Allergic rhinitis due to animal (cat) (dog) hair and dander: Secondary | ICD-10-CM | POA: Diagnosis not present

## 2018-05-17 DIAGNOSIS — J3089 Other allergic rhinitis: Secondary | ICD-10-CM | POA: Diagnosis not present

## 2018-05-17 DIAGNOSIS — J301 Allergic rhinitis due to pollen: Secondary | ICD-10-CM | POA: Diagnosis not present

## 2018-05-24 DIAGNOSIS — J301 Allergic rhinitis due to pollen: Secondary | ICD-10-CM | POA: Diagnosis not present

## 2018-05-24 DIAGNOSIS — J3081 Allergic rhinitis due to animal (cat) (dog) hair and dander: Secondary | ICD-10-CM | POA: Diagnosis not present

## 2018-05-24 DIAGNOSIS — J3089 Other allergic rhinitis: Secondary | ICD-10-CM | POA: Diagnosis not present

## 2018-05-25 DIAGNOSIS — J3089 Other allergic rhinitis: Secondary | ICD-10-CM | POA: Diagnosis not present

## 2018-05-25 DIAGNOSIS — J3081 Allergic rhinitis due to animal (cat) (dog) hair and dander: Secondary | ICD-10-CM | POA: Diagnosis not present

## 2018-05-31 DIAGNOSIS — J301 Allergic rhinitis due to pollen: Secondary | ICD-10-CM | POA: Diagnosis not present

## 2018-05-31 DIAGNOSIS — J3089 Other allergic rhinitis: Secondary | ICD-10-CM | POA: Diagnosis not present

## 2018-05-31 DIAGNOSIS — J3081 Allergic rhinitis due to animal (cat) (dog) hair and dander: Secondary | ICD-10-CM | POA: Diagnosis not present

## 2018-06-10 DIAGNOSIS — J3089 Other allergic rhinitis: Secondary | ICD-10-CM | POA: Diagnosis not present

## 2018-06-10 DIAGNOSIS — J3081 Allergic rhinitis due to animal (cat) (dog) hair and dander: Secondary | ICD-10-CM | POA: Diagnosis not present

## 2018-06-10 DIAGNOSIS — J301 Allergic rhinitis due to pollen: Secondary | ICD-10-CM | POA: Diagnosis not present

## 2018-06-15 DIAGNOSIS — J301 Allergic rhinitis due to pollen: Secondary | ICD-10-CM | POA: Diagnosis not present

## 2018-06-15 DIAGNOSIS — J3081 Allergic rhinitis due to animal (cat) (dog) hair and dander: Secondary | ICD-10-CM | POA: Diagnosis not present

## 2018-06-15 DIAGNOSIS — J3089 Other allergic rhinitis: Secondary | ICD-10-CM | POA: Diagnosis not present

## 2018-06-18 DIAGNOSIS — J3081 Allergic rhinitis due to animal (cat) (dog) hair and dander: Secondary | ICD-10-CM | POA: Diagnosis not present

## 2018-06-18 DIAGNOSIS — J3089 Other allergic rhinitis: Secondary | ICD-10-CM | POA: Diagnosis not present

## 2018-06-18 DIAGNOSIS — J301 Allergic rhinitis due to pollen: Secondary | ICD-10-CM | POA: Diagnosis not present

## 2018-06-21 DIAGNOSIS — J301 Allergic rhinitis due to pollen: Secondary | ICD-10-CM | POA: Diagnosis not present

## 2018-06-21 DIAGNOSIS — J3089 Other allergic rhinitis: Secondary | ICD-10-CM | POA: Diagnosis not present

## 2018-06-21 DIAGNOSIS — J3081 Allergic rhinitis due to animal (cat) (dog) hair and dander: Secondary | ICD-10-CM | POA: Diagnosis not present

## 2018-06-24 DIAGNOSIS — J301 Allergic rhinitis due to pollen: Secondary | ICD-10-CM | POA: Diagnosis not present

## 2018-06-24 DIAGNOSIS — J3089 Other allergic rhinitis: Secondary | ICD-10-CM | POA: Diagnosis not present

## 2018-06-24 DIAGNOSIS — J3081 Allergic rhinitis due to animal (cat) (dog) hair and dander: Secondary | ICD-10-CM | POA: Diagnosis not present

## 2018-06-28 DIAGNOSIS — J3081 Allergic rhinitis due to animal (cat) (dog) hair and dander: Secondary | ICD-10-CM | POA: Diagnosis not present

## 2018-06-28 DIAGNOSIS — J301 Allergic rhinitis due to pollen: Secondary | ICD-10-CM | POA: Diagnosis not present

## 2018-06-28 DIAGNOSIS — J3089 Other allergic rhinitis: Secondary | ICD-10-CM | POA: Diagnosis not present

## 2018-07-05 DIAGNOSIS — J3089 Other allergic rhinitis: Secondary | ICD-10-CM | POA: Diagnosis not present

## 2018-07-05 DIAGNOSIS — J3081 Allergic rhinitis due to animal (cat) (dog) hair and dander: Secondary | ICD-10-CM | POA: Diagnosis not present

## 2018-07-05 DIAGNOSIS — J301 Allergic rhinitis due to pollen: Secondary | ICD-10-CM | POA: Diagnosis not present

## 2018-07-12 DIAGNOSIS — J301 Allergic rhinitis due to pollen: Secondary | ICD-10-CM | POA: Diagnosis not present

## 2018-07-12 DIAGNOSIS — J3081 Allergic rhinitis due to animal (cat) (dog) hair and dander: Secondary | ICD-10-CM | POA: Diagnosis not present

## 2018-07-12 DIAGNOSIS — J3089 Other allergic rhinitis: Secondary | ICD-10-CM | POA: Diagnosis not present

## 2018-07-20 DIAGNOSIS — J301 Allergic rhinitis due to pollen: Secondary | ICD-10-CM | POA: Diagnosis not present

## 2018-07-20 DIAGNOSIS — J3089 Other allergic rhinitis: Secondary | ICD-10-CM | POA: Diagnosis not present

## 2018-07-20 DIAGNOSIS — J3081 Allergic rhinitis due to animal (cat) (dog) hair and dander: Secondary | ICD-10-CM | POA: Diagnosis not present

## 2018-07-28 DIAGNOSIS — J301 Allergic rhinitis due to pollen: Secondary | ICD-10-CM | POA: Diagnosis not present

## 2018-07-28 DIAGNOSIS — J3081 Allergic rhinitis due to animal (cat) (dog) hair and dander: Secondary | ICD-10-CM | POA: Diagnosis not present

## 2018-07-28 DIAGNOSIS — J3089 Other allergic rhinitis: Secondary | ICD-10-CM | POA: Diagnosis not present

## 2018-08-03 DIAGNOSIS — J3081 Allergic rhinitis due to animal (cat) (dog) hair and dander: Secondary | ICD-10-CM | POA: Diagnosis not present

## 2018-08-03 DIAGNOSIS — J301 Allergic rhinitis due to pollen: Secondary | ICD-10-CM | POA: Diagnosis not present

## 2018-08-03 DIAGNOSIS — J3089 Other allergic rhinitis: Secondary | ICD-10-CM | POA: Diagnosis not present

## 2018-08-10 DIAGNOSIS — J301 Allergic rhinitis due to pollen: Secondary | ICD-10-CM | POA: Diagnosis not present

## 2018-08-10 DIAGNOSIS — J3081 Allergic rhinitis due to animal (cat) (dog) hair and dander: Secondary | ICD-10-CM | POA: Diagnosis not present

## 2018-08-10 DIAGNOSIS — J3089 Other allergic rhinitis: Secondary | ICD-10-CM | POA: Diagnosis not present

## 2018-08-19 DIAGNOSIS — J301 Allergic rhinitis due to pollen: Secondary | ICD-10-CM | POA: Diagnosis not present

## 2018-08-19 DIAGNOSIS — J3081 Allergic rhinitis due to animal (cat) (dog) hair and dander: Secondary | ICD-10-CM | POA: Diagnosis not present

## 2018-08-19 DIAGNOSIS — J3089 Other allergic rhinitis: Secondary | ICD-10-CM | POA: Diagnosis not present

## 2018-08-24 DIAGNOSIS — J3081 Allergic rhinitis due to animal (cat) (dog) hair and dander: Secondary | ICD-10-CM | POA: Diagnosis not present

## 2018-08-24 DIAGNOSIS — J3089 Other allergic rhinitis: Secondary | ICD-10-CM | POA: Diagnosis not present

## 2018-08-24 DIAGNOSIS — J301 Allergic rhinitis due to pollen: Secondary | ICD-10-CM | POA: Diagnosis not present

## 2018-08-30 DIAGNOSIS — J301 Allergic rhinitis due to pollen: Secondary | ICD-10-CM | POA: Diagnosis not present

## 2018-08-30 DIAGNOSIS — J3081 Allergic rhinitis due to animal (cat) (dog) hair and dander: Secondary | ICD-10-CM | POA: Diagnosis not present

## 2018-08-30 DIAGNOSIS — J3089 Other allergic rhinitis: Secondary | ICD-10-CM | POA: Diagnosis not present

## 2018-09-07 DIAGNOSIS — J3089 Other allergic rhinitis: Secondary | ICD-10-CM | POA: Diagnosis not present

## 2018-09-07 DIAGNOSIS — J3081 Allergic rhinitis due to animal (cat) (dog) hair and dander: Secondary | ICD-10-CM | POA: Diagnosis not present

## 2018-09-07 DIAGNOSIS — J301 Allergic rhinitis due to pollen: Secondary | ICD-10-CM | POA: Diagnosis not present

## 2018-09-14 DIAGNOSIS — J3081 Allergic rhinitis due to animal (cat) (dog) hair and dander: Secondary | ICD-10-CM | POA: Diagnosis not present

## 2018-09-14 DIAGNOSIS — J301 Allergic rhinitis due to pollen: Secondary | ICD-10-CM | POA: Diagnosis not present

## 2018-09-14 DIAGNOSIS — J3089 Other allergic rhinitis: Secondary | ICD-10-CM | POA: Diagnosis not present

## 2018-09-21 DIAGNOSIS — J3089 Other allergic rhinitis: Secondary | ICD-10-CM | POA: Diagnosis not present

## 2018-09-21 DIAGNOSIS — J301 Allergic rhinitis due to pollen: Secondary | ICD-10-CM | POA: Diagnosis not present

## 2018-09-21 DIAGNOSIS — J3081 Allergic rhinitis due to animal (cat) (dog) hair and dander: Secondary | ICD-10-CM | POA: Diagnosis not present

## 2018-09-30 DIAGNOSIS — J3081 Allergic rhinitis due to animal (cat) (dog) hair and dander: Secondary | ICD-10-CM | POA: Diagnosis not present

## 2018-09-30 DIAGNOSIS — J3089 Other allergic rhinitis: Secondary | ICD-10-CM | POA: Diagnosis not present

## 2018-09-30 DIAGNOSIS — J301 Allergic rhinitis due to pollen: Secondary | ICD-10-CM | POA: Diagnosis not present

## 2018-10-05 DIAGNOSIS — J3081 Allergic rhinitis due to animal (cat) (dog) hair and dander: Secondary | ICD-10-CM | POA: Diagnosis not present

## 2018-10-05 DIAGNOSIS — J3089 Other allergic rhinitis: Secondary | ICD-10-CM | POA: Diagnosis not present

## 2018-10-05 DIAGNOSIS — J301 Allergic rhinitis due to pollen: Secondary | ICD-10-CM | POA: Diagnosis not present

## 2018-10-13 DIAGNOSIS — J3081 Allergic rhinitis due to animal (cat) (dog) hair and dander: Secondary | ICD-10-CM | POA: Diagnosis not present

## 2018-10-13 DIAGNOSIS — J301 Allergic rhinitis due to pollen: Secondary | ICD-10-CM | POA: Diagnosis not present

## 2018-10-13 DIAGNOSIS — J3089 Other allergic rhinitis: Secondary | ICD-10-CM | POA: Diagnosis not present

## 2018-10-18 DIAGNOSIS — J3081 Allergic rhinitis due to animal (cat) (dog) hair and dander: Secondary | ICD-10-CM | POA: Diagnosis not present

## 2018-10-18 DIAGNOSIS — J301 Allergic rhinitis due to pollen: Secondary | ICD-10-CM | POA: Diagnosis not present

## 2018-10-18 DIAGNOSIS — J3089 Other allergic rhinitis: Secondary | ICD-10-CM | POA: Diagnosis not present

## 2018-10-27 DIAGNOSIS — J3081 Allergic rhinitis due to animal (cat) (dog) hair and dander: Secondary | ICD-10-CM | POA: Diagnosis not present

## 2018-10-27 DIAGNOSIS — J3089 Other allergic rhinitis: Secondary | ICD-10-CM | POA: Diagnosis not present

## 2018-10-27 DIAGNOSIS — J301 Allergic rhinitis due to pollen: Secondary | ICD-10-CM | POA: Diagnosis not present

## 2018-11-01 DIAGNOSIS — J3089 Other allergic rhinitis: Secondary | ICD-10-CM | POA: Diagnosis not present

## 2018-11-01 DIAGNOSIS — J301 Allergic rhinitis due to pollen: Secondary | ICD-10-CM | POA: Diagnosis not present

## 2018-11-01 DIAGNOSIS — J3081 Allergic rhinitis due to animal (cat) (dog) hair and dander: Secondary | ICD-10-CM | POA: Diagnosis not present

## 2018-11-08 DIAGNOSIS — J301 Allergic rhinitis due to pollen: Secondary | ICD-10-CM | POA: Diagnosis not present

## 2018-11-08 DIAGNOSIS — J3089 Other allergic rhinitis: Secondary | ICD-10-CM | POA: Diagnosis not present

## 2018-11-08 DIAGNOSIS — J3081 Allergic rhinitis due to animal (cat) (dog) hair and dander: Secondary | ICD-10-CM | POA: Diagnosis not present

## 2018-11-10 DIAGNOSIS — J301 Allergic rhinitis due to pollen: Secondary | ICD-10-CM | POA: Diagnosis not present

## 2018-11-10 DIAGNOSIS — J3081 Allergic rhinitis due to animal (cat) (dog) hair and dander: Secondary | ICD-10-CM | POA: Diagnosis not present

## 2018-11-10 DIAGNOSIS — J3089 Other allergic rhinitis: Secondary | ICD-10-CM | POA: Diagnosis not present

## 2018-11-12 DIAGNOSIS — J3081 Allergic rhinitis due to animal (cat) (dog) hair and dander: Secondary | ICD-10-CM | POA: Diagnosis not present

## 2018-11-12 DIAGNOSIS — J301 Allergic rhinitis due to pollen: Secondary | ICD-10-CM | POA: Diagnosis not present

## 2018-11-12 DIAGNOSIS — J3089 Other allergic rhinitis: Secondary | ICD-10-CM | POA: Diagnosis not present

## 2018-11-15 DIAGNOSIS — J3089 Other allergic rhinitis: Secondary | ICD-10-CM | POA: Diagnosis not present

## 2018-11-15 DIAGNOSIS — J3081 Allergic rhinitis due to animal (cat) (dog) hair and dander: Secondary | ICD-10-CM | POA: Diagnosis not present

## 2018-11-15 DIAGNOSIS — J301 Allergic rhinitis due to pollen: Secondary | ICD-10-CM | POA: Diagnosis not present

## 2018-11-17 DIAGNOSIS — J3089 Other allergic rhinitis: Secondary | ICD-10-CM | POA: Diagnosis not present

## 2018-11-17 DIAGNOSIS — J3081 Allergic rhinitis due to animal (cat) (dog) hair and dander: Secondary | ICD-10-CM | POA: Diagnosis not present

## 2018-11-17 DIAGNOSIS — J301 Allergic rhinitis due to pollen: Secondary | ICD-10-CM | POA: Diagnosis not present

## 2018-11-23 DIAGNOSIS — J3089 Other allergic rhinitis: Secondary | ICD-10-CM | POA: Diagnosis not present

## 2018-11-23 DIAGNOSIS — J301 Allergic rhinitis due to pollen: Secondary | ICD-10-CM | POA: Diagnosis not present

## 2018-11-23 DIAGNOSIS — J3081 Allergic rhinitis due to animal (cat) (dog) hair and dander: Secondary | ICD-10-CM | POA: Diagnosis not present

## 2018-11-25 DIAGNOSIS — J3089 Other allergic rhinitis: Secondary | ICD-10-CM | POA: Diagnosis not present

## 2018-11-25 DIAGNOSIS — J301 Allergic rhinitis due to pollen: Secondary | ICD-10-CM | POA: Diagnosis not present

## 2018-11-25 DIAGNOSIS — J3081 Allergic rhinitis due to animal (cat) (dog) hair and dander: Secondary | ICD-10-CM | POA: Diagnosis not present

## 2018-11-29 DIAGNOSIS — J3081 Allergic rhinitis due to animal (cat) (dog) hair and dander: Secondary | ICD-10-CM | POA: Diagnosis not present

## 2018-11-29 DIAGNOSIS — J301 Allergic rhinitis due to pollen: Secondary | ICD-10-CM | POA: Diagnosis not present

## 2018-11-29 DIAGNOSIS — J3089 Other allergic rhinitis: Secondary | ICD-10-CM | POA: Diagnosis not present

## 2018-12-07 DIAGNOSIS — J3081 Allergic rhinitis due to animal (cat) (dog) hair and dander: Secondary | ICD-10-CM | POA: Diagnosis not present

## 2018-12-07 DIAGNOSIS — J3089 Other allergic rhinitis: Secondary | ICD-10-CM | POA: Diagnosis not present

## 2018-12-07 DIAGNOSIS — J301 Allergic rhinitis due to pollen: Secondary | ICD-10-CM | POA: Diagnosis not present

## 2018-12-13 DIAGNOSIS — J3081 Allergic rhinitis due to animal (cat) (dog) hair and dander: Secondary | ICD-10-CM | POA: Diagnosis not present

## 2018-12-13 DIAGNOSIS — J3089 Other allergic rhinitis: Secondary | ICD-10-CM | POA: Diagnosis not present

## 2018-12-13 DIAGNOSIS — J301 Allergic rhinitis due to pollen: Secondary | ICD-10-CM | POA: Diagnosis not present

## 2018-12-15 DIAGNOSIS — J3081 Allergic rhinitis due to animal (cat) (dog) hair and dander: Secondary | ICD-10-CM | POA: Diagnosis not present

## 2018-12-15 DIAGNOSIS — J301 Allergic rhinitis due to pollen: Secondary | ICD-10-CM | POA: Diagnosis not present

## 2018-12-15 DIAGNOSIS — J3089 Other allergic rhinitis: Secondary | ICD-10-CM | POA: Diagnosis not present

## 2018-12-21 DIAGNOSIS — J301 Allergic rhinitis due to pollen: Secondary | ICD-10-CM | POA: Diagnosis not present

## 2018-12-21 DIAGNOSIS — J3081 Allergic rhinitis due to animal (cat) (dog) hair and dander: Secondary | ICD-10-CM | POA: Diagnosis not present

## 2018-12-21 DIAGNOSIS — J3089 Other allergic rhinitis: Secondary | ICD-10-CM | POA: Diagnosis not present

## 2018-12-28 DIAGNOSIS — J3089 Other allergic rhinitis: Secondary | ICD-10-CM | POA: Diagnosis not present

## 2018-12-28 DIAGNOSIS — J301 Allergic rhinitis due to pollen: Secondary | ICD-10-CM | POA: Diagnosis not present

## 2018-12-28 DIAGNOSIS — J3081 Allergic rhinitis due to animal (cat) (dog) hair and dander: Secondary | ICD-10-CM | POA: Diagnosis not present

## 2018-12-29 DIAGNOSIS — J301 Allergic rhinitis due to pollen: Secondary | ICD-10-CM | POA: Diagnosis not present

## 2018-12-30 DIAGNOSIS — J3089 Other allergic rhinitis: Secondary | ICD-10-CM | POA: Diagnosis not present

## 2018-12-30 DIAGNOSIS — J3081 Allergic rhinitis due to animal (cat) (dog) hair and dander: Secondary | ICD-10-CM | POA: Diagnosis not present

## 2019-01-04 DIAGNOSIS — J3081 Allergic rhinitis due to animal (cat) (dog) hair and dander: Secondary | ICD-10-CM | POA: Diagnosis not present

## 2019-01-04 DIAGNOSIS — J3089 Other allergic rhinitis: Secondary | ICD-10-CM | POA: Diagnosis not present

## 2019-01-04 DIAGNOSIS — J301 Allergic rhinitis due to pollen: Secondary | ICD-10-CM | POA: Diagnosis not present

## 2019-01-11 DIAGNOSIS — J301 Allergic rhinitis due to pollen: Secondary | ICD-10-CM | POA: Diagnosis not present

## 2019-01-11 DIAGNOSIS — J3089 Other allergic rhinitis: Secondary | ICD-10-CM | POA: Diagnosis not present

## 2019-01-11 DIAGNOSIS — J3081 Allergic rhinitis due to animal (cat) (dog) hair and dander: Secondary | ICD-10-CM | POA: Diagnosis not present

## 2019-01-18 DIAGNOSIS — J3089 Other allergic rhinitis: Secondary | ICD-10-CM | POA: Diagnosis not present

## 2019-01-18 DIAGNOSIS — J3081 Allergic rhinitis due to animal (cat) (dog) hair and dander: Secondary | ICD-10-CM | POA: Diagnosis not present

## 2019-01-18 DIAGNOSIS — J301 Allergic rhinitis due to pollen: Secondary | ICD-10-CM | POA: Diagnosis not present

## 2019-01-21 DIAGNOSIS — J3081 Allergic rhinitis due to animal (cat) (dog) hair and dander: Secondary | ICD-10-CM | POA: Diagnosis not present

## 2019-01-21 DIAGNOSIS — J3089 Other allergic rhinitis: Secondary | ICD-10-CM | POA: Diagnosis not present

## 2019-01-21 DIAGNOSIS — J301 Allergic rhinitis due to pollen: Secondary | ICD-10-CM | POA: Diagnosis not present

## 2019-01-24 DIAGNOSIS — J301 Allergic rhinitis due to pollen: Secondary | ICD-10-CM | POA: Diagnosis not present

## 2019-01-24 DIAGNOSIS — J3089 Other allergic rhinitis: Secondary | ICD-10-CM | POA: Diagnosis not present

## 2019-01-24 DIAGNOSIS — J3081 Allergic rhinitis due to animal (cat) (dog) hair and dander: Secondary | ICD-10-CM | POA: Diagnosis not present

## 2019-01-26 DIAGNOSIS — J3089 Other allergic rhinitis: Secondary | ICD-10-CM | POA: Diagnosis not present

## 2019-01-26 DIAGNOSIS — J301 Allergic rhinitis due to pollen: Secondary | ICD-10-CM | POA: Diagnosis not present

## 2019-01-26 DIAGNOSIS — J3081 Allergic rhinitis due to animal (cat) (dog) hair and dander: Secondary | ICD-10-CM | POA: Diagnosis not present

## 2019-02-02 DIAGNOSIS — J3089 Other allergic rhinitis: Secondary | ICD-10-CM | POA: Diagnosis not present

## 2019-02-02 DIAGNOSIS — J301 Allergic rhinitis due to pollen: Secondary | ICD-10-CM | POA: Diagnosis not present

## 2019-02-02 DIAGNOSIS — J3081 Allergic rhinitis due to animal (cat) (dog) hair and dander: Secondary | ICD-10-CM | POA: Diagnosis not present

## 2019-02-08 DIAGNOSIS — J3089 Other allergic rhinitis: Secondary | ICD-10-CM | POA: Diagnosis not present

## 2019-02-08 DIAGNOSIS — J301 Allergic rhinitis due to pollen: Secondary | ICD-10-CM | POA: Diagnosis not present

## 2019-02-08 DIAGNOSIS — J3081 Allergic rhinitis due to animal (cat) (dog) hair and dander: Secondary | ICD-10-CM | POA: Diagnosis not present

## 2019-02-16 DIAGNOSIS — J3089 Other allergic rhinitis: Secondary | ICD-10-CM | POA: Diagnosis not present

## 2019-02-16 DIAGNOSIS — J301 Allergic rhinitis due to pollen: Secondary | ICD-10-CM | POA: Diagnosis not present

## 2019-02-16 DIAGNOSIS — J3081 Allergic rhinitis due to animal (cat) (dog) hair and dander: Secondary | ICD-10-CM | POA: Diagnosis not present

## 2019-02-21 ENCOUNTER — Other Ambulatory Visit: Payer: Self-pay

## 2019-02-21 ENCOUNTER — Ambulatory Visit (INDEPENDENT_AMBULATORY_CARE_PROVIDER_SITE_OTHER): Payer: Self-pay | Admitting: Emergency Medicine

## 2019-02-21 ENCOUNTER — Encounter: Payer: Self-pay | Admitting: Emergency Medicine

## 2019-02-21 VITALS — BP 128/79 | HR 60 | Temp 97.9°F | Resp 16 | Ht 69.0 in | Wt 252.0 lb

## 2019-02-21 DIAGNOSIS — J3089 Other allergic rhinitis: Secondary | ICD-10-CM | POA: Diagnosis not present

## 2019-02-21 DIAGNOSIS — Z024 Encounter for examination for driving license: Secondary | ICD-10-CM

## 2019-02-21 DIAGNOSIS — J3081 Allergic rhinitis due to animal (cat) (dog) hair and dander: Secondary | ICD-10-CM | POA: Diagnosis not present

## 2019-02-21 DIAGNOSIS — J301 Allergic rhinitis due to pollen: Secondary | ICD-10-CM | POA: Diagnosis not present

## 2019-02-21 NOTE — Progress Notes (Signed)
BP Readings from Last 3 Encounters:  02/21/19 128/79  01/29/17 120/60  04/25/15 118/74       This patient presents for DOT examination for fitness for duty.   Medical History:  1. Head/Brain Injuries, disorders or illnesses no  2. Seizures, epilepsy no  3. Eye disorders or impaired vision (except corrective lenses) no  4. Ear disorders, loss of hearing or balance no  5. Heart disease or heart attack, other cardiovascular condition no  6. Heart surgery (valve replacement/bypass, angioplasty, pacemaker/defribrillator) no  7. High blood pressure no  8. High cholesterol no  9. Chronic cough, shortness of breath or other breathing problems no  10. Lung disease (emphysema, asthma or chronic bronchitis) no  11. Kidney disease, dialysis no  12. Digestive problems  no  13. Diabetes or elevated blood sugar no                      If yes to #13, Insulin use n/a  14. Nervious or psychiatric disorders, e.g., severe depression no  15. Fainting or syncope no  16. Dizziness, headaches, numbness, tingling or memory loss no  17. Unexplained weight loss no  18. Stroke, TIA or paralysis no  19. Missing or impaired hand, arm, foot, leg, finger, toe no  20. Spinal injury or disease no  21. Bone, muscles or nerve problems no  22. Blood clots or bleeding bleeding disorders no  23. Cancer no  24. Chronic infection or other chronic diseases no  25. Sleep disorders, pauses in breathing while asleep, daytime sleepiness, loud snoring no  26. Have you ever had a sleep test? no  27.  Have you ever spent a night in the hospital? no  28. Have you ever had a broken bone? no  29. Have you or or do you use tobacco products? no  30. Regular, frequent alcohol use no  31. Illegal substance use within the past 2 years no  32.  Have you ever failed a drug test or been dependent on an illegal substance? no   Current Medications: Prior to Admission medications   Medication Sig Start Date End Date Taking?  Authorizing Provider  cyclobenzaprine (FLEXERIL) 10 MG tablet Take 1 tablet (10 mg total) by mouth 3 (three) times daily as needed for muscle spasms. Patient not taking: Reported on 02/21/2019 01/29/17   Myles Lipps, MD  ibuprofen (ADVIL,MOTRIN) 600 MG tablet Take 1 tablet (600 mg total) by mouth every 8 (eight) hours as needed. Patient not taking: Reported on 02/21/2019 01/29/17   Myles Lipps, MD    Medical Examiner's Comments on Health History:  In good general medical condition  TESTING:   Hearing Screening   125Hz  250Hz  500Hz  1000Hz  2000Hz  3000Hz  4000Hz  6000Hz  8000Hz   Right ear:           Left ear:           Comments: Whisper test: R ear 10 ft   L  ear 10 ft   Visual Acuity Screening   Right eye Left eye Both eyes  Without correction: 20/20 20/20 20/20   With correction:     Comments: Colors: 6/6 Periph:  R eye 85 degrees   L eye  85 degrees   Monocular Vision: No.  Hearing Aid used for test: No. Hearing Aid required to to meet standard: No.  BP 128/79   Pulse 60   Temp 97.9 F (36.6 C) (Temporal)   Resp 16   Ht 5\' 9"  (1.753  m) Comment: per pt  Wt 252 lb (114.3 kg)   SpO2 100%   BMI 37.21 kg/m  Pulse rate is regular  Comments: U/A: blood--negative, protein-negative, spg-1.010, glucose-negative  PHYSICAL EXAMINATION:  General Appearance Not markedly obese. No tremor, signs of alcoholism, problem drinking or drug abuse.   Skin Warm, dry and intact.   Eyes Pupils are equal, round and reactive to light and accommodation, extraocular movements are intact. No exophthalmos, no nystagmus.  Ears Normal external ears. External canal without occlusion. No scarring of the TM. No perforation of the TM.  Mouth and Throat Clear and moist. No irremedial deformities likely to interfere with breathing or swallowing.  Heart No murmurs, extra sounds, evidence of cardiomegaly. No pacemaker. No implantable defibrillator.  Lungs and Chest (excluding breasts) Normal chest  expansion, respiratory rate, breath sounds. No cyanosis.  Abdomen and Viscera No liver enlargement. No splenic enlargement. No masses, bruits, hernias or significant abdominal wall weakness.  Genitourinary  No inguinal or femoral hernia.  Spine and other musculoskeletal No tenderness, no limitation of motion, no deformities. No evidence of previous surgery.  Extremities No loss or impairment of leg, foot, toe, arm, hand, finger. No perceptible limp, deformities, atrophy, weakness, paralysis, clubbing, edema, hypotonia. Patient has sufficient grasp and prehension to maintain steering wheel grip. Patient has sufficient mobility and strength in the lower limbs to operate pedals properly.  Neurologic Normal equilibrium, coordination, speech pattern. No paresthesia, asymmetry of deep tendon reflexes, sensory or positional abnormalities. No abnormality of patellar or Babinski's reflexes.  Gait Not antalgic or ataxic  Vascular Normal pulses. No carotid or arterial bruits. No varicose veins.    Certification Status: does meet standards for 2 year certificate.    Certification expires 02/20/2021

## 2019-02-21 NOTE — Patient Instructions (Addendum)
   If you have lab work done today you will be contacted with your lab results within the next 2 weeks.  If you have not heard from us then please contact us. The fastest way to get your results is to register for My Chart.   IF you received an x-ray today, you will receive an invoice from [facility]. Please contact [facility] at [phone] with questions or concerns regarding your invoice.   IF you received labwork today, you will receive an invoice from [facility]. Please contact [facility] at [phone] with questions or concerns regarding your invoice.   Our billing staff will not be able to assist you with questions regarding bills from these companies.  You will be contacted with the lab results as soon as they are available. The fastest way to get your results is to activate your My Chart account. Instructions are located on the last page of this paperwork. If you have not heard from us regarding the results in 2 weeks, please contact this office.      Health Maintenance, Male Adopting a healthy lifestyle and getting preventive care are important in promoting health and wellness. Ask your health care provider about:  The right schedule for you to have regular tests and exams.  Things you can do on your own to prevent diseases and keep yourself healthy. What should I know about diet, weight, and exercise? Eat a healthy diet   Eat a diet that includes plenty of vegetables, fruits, low-fat dairy products, and lean protein.  Do not eat a lot of foods that are high in solid fats, added sugars, or sodium. Maintain a healthy weight Body mass index (BMI) is a measurement that can be used to identify possible weight problems. It estimates body fat based on height and weight. Your health care provider can help determine your BMI and help you achieve or maintain a healthy weight. Get regular exercise Get regular exercise. This is one of the most important things you  can do for your health. Most adults should:  Exercise for at least 150 minutes each week. The exercise should increase your heart rate and make you sweat (moderate-intensity exercise).  Do strengthening exercises at least twice a week. This is in addition to the moderate-intensity exercise.  Spend less time sitting. Even light physical activity can be beneficial. Watch cholesterol and blood lipids Have your blood tested for lipids and cholesterol at 44 years of age, then have this test every 5 years. You may need to have your cholesterol levels checked more often if:  Your lipid or cholesterol levels are high.  You are older than 44 years of age.  You are at high risk for heart disease. What should I know about cancer screening? Many types of cancers can be detected early and may often be prevented. Depending on your health history and family history, you may need to have cancer screening at various ages. This may include screening for:  Colorectal cancer.  Prostate cancer.  Skin cancer.  Lung cancer. What should I know about heart disease, diabetes, and high blood pressure? Blood pressure and heart disease  High blood pressure causes heart disease and increases the risk of stroke. This is more likely to develop in people who have high blood pressure readings, are of African descent, or are overweight.  Talk with your health care provider about your target blood pressure readings.  Have your blood pressure checked: ? Every 3-5 years if you are 18-39   years of age. ? Every year if you are 40 years old or older.  If you are between the ages of 65 and 75 and are a current or former smoker, ask your health care provider if you should have a one-time screening for abdominal aortic aneurysm (AAA). Diabetes Have regular diabetes screenings. This checks your fasting blood sugar level. Have the screening done:  Once every three years after age 45 if you are at a normal weight and have  a low risk for diabetes.  More often and at a younger age if you are overweight or have a high risk for diabetes. What should I know about preventing infection? Hepatitis B If you have a higher risk for hepatitis B, you should be screened for this virus. Talk with your health care provider to find out if you are at risk for hepatitis B infection. Hepatitis C Blood testing is recommended for:  Everyone born from 1945 through 1965.  Anyone with known risk factors for hepatitis C. Sexually transmitted infections (STIs)  You should be screened each year for STIs, including gonorrhea and chlamydia, if: ? You are sexually active and are younger than 44 years of age. ? You are older than 44 years of age and your health care provider tells you that you are at risk for this type of infection. ? Your sexual activity has changed since you were last screened, and you are at increased risk for chlamydia or gonorrhea. Ask your health care provider if you are at risk.  Ask your health care provider about whether you are at high risk for HIV. Your health care provider may recommend a prescription medicine to help prevent HIV infection. If you choose to take medicine to prevent HIV, you should first get tested for HIV. You should then be tested every 3 months for as long as you are taking the medicine. Follow these instructions at home: Lifestyle  Do not use any products that contain nicotine or tobacco, such as cigarettes, e-cigarettes, and chewing tobacco. If you need help quitting, ask your health care provider.  Do not use street drugs.  Do not share needles.  Ask your health care provider for help if you need support or information about quitting drugs. Alcohol use  Do not drink alcohol if your health care provider tells you not to drink.  If you drink alcohol: ? Limit how much you have to 0-2 drinks a day. ? Be aware of how much alcohol is in your drink. In the [place] one drink equals one 12  oz bottle of beer (355 mL), one 5 oz glass of wine (148 mL), or one 1 oz glass of hard liquor (44 mL). General instructions  Schedule regular health, dental, and eye exams.  Stay current with your vaccines.  Tell your health care provider if: ? You often feel depressed. ? You have ever been abused or do not feel safe at home. Summary  Adopting a healthy lifestyle and getting preventive care are important in promoting health and wellness.  Follow your health care provider's instructions about healthy diet, exercising, and getting tested or screened for diseases.  Follow your health care provider's instructions on monitoring your cholesterol and blood pressure. This information is not intended to replace advice given to you by your health care provider. Make sure you discuss any questions you have with your health care provider. Document Revised: 12/23/2017 Document Reviewed: 12/23/2017 Elsevier Patient Education  2020 [employer] Inc.  

## 2019-03-01 DIAGNOSIS — J301 Allergic rhinitis due to pollen: Secondary | ICD-10-CM | POA: Diagnosis not present

## 2019-03-01 DIAGNOSIS — J3081 Allergic rhinitis due to animal (cat) (dog) hair and dander: Secondary | ICD-10-CM | POA: Diagnosis not present

## 2019-03-01 DIAGNOSIS — J3089 Other allergic rhinitis: Secondary | ICD-10-CM | POA: Diagnosis not present

## 2019-03-08 DIAGNOSIS — J3089 Other allergic rhinitis: Secondary | ICD-10-CM | POA: Diagnosis not present

## 2019-03-08 DIAGNOSIS — J301 Allergic rhinitis due to pollen: Secondary | ICD-10-CM | POA: Diagnosis not present

## 2019-03-08 DIAGNOSIS — J3081 Allergic rhinitis due to animal (cat) (dog) hair and dander: Secondary | ICD-10-CM | POA: Diagnosis not present

## 2019-03-15 DIAGNOSIS — J3089 Other allergic rhinitis: Secondary | ICD-10-CM | POA: Diagnosis not present

## 2019-03-15 DIAGNOSIS — J301 Allergic rhinitis due to pollen: Secondary | ICD-10-CM | POA: Diagnosis not present

## 2019-03-15 DIAGNOSIS — J3081 Allergic rhinitis due to animal (cat) (dog) hair and dander: Secondary | ICD-10-CM | POA: Diagnosis not present

## 2019-03-22 DIAGNOSIS — J3089 Other allergic rhinitis: Secondary | ICD-10-CM | POA: Diagnosis not present

## 2019-03-22 DIAGNOSIS — J3081 Allergic rhinitis due to animal (cat) (dog) hair and dander: Secondary | ICD-10-CM | POA: Diagnosis not present

## 2019-03-22 DIAGNOSIS — J301 Allergic rhinitis due to pollen: Secondary | ICD-10-CM | POA: Diagnosis not present

## 2019-03-29 DIAGNOSIS — J3081 Allergic rhinitis due to animal (cat) (dog) hair and dander: Secondary | ICD-10-CM | POA: Diagnosis not present

## 2019-03-29 DIAGNOSIS — J3089 Other allergic rhinitis: Secondary | ICD-10-CM | POA: Diagnosis not present

## 2019-03-29 DIAGNOSIS — J301 Allergic rhinitis due to pollen: Secondary | ICD-10-CM | POA: Diagnosis not present

## 2019-04-05 DIAGNOSIS — J3089 Other allergic rhinitis: Secondary | ICD-10-CM | POA: Diagnosis not present

## 2019-04-05 DIAGNOSIS — J3081 Allergic rhinitis due to animal (cat) (dog) hair and dander: Secondary | ICD-10-CM | POA: Diagnosis not present

## 2019-04-05 DIAGNOSIS — J301 Allergic rhinitis due to pollen: Secondary | ICD-10-CM | POA: Diagnosis not present

## 2019-04-12 DIAGNOSIS — J3089 Other allergic rhinitis: Secondary | ICD-10-CM | POA: Diagnosis not present

## 2019-04-12 DIAGNOSIS — J301 Allergic rhinitis due to pollen: Secondary | ICD-10-CM | POA: Diagnosis not present

## 2019-04-12 DIAGNOSIS — J3081 Allergic rhinitis due to animal (cat) (dog) hair and dander: Secondary | ICD-10-CM | POA: Diagnosis not present

## 2019-04-19 DIAGNOSIS — J3089 Other allergic rhinitis: Secondary | ICD-10-CM | POA: Diagnosis not present

## 2019-04-19 DIAGNOSIS — J301 Allergic rhinitis due to pollen: Secondary | ICD-10-CM | POA: Diagnosis not present

## 2019-04-19 DIAGNOSIS — J3081 Allergic rhinitis due to animal (cat) (dog) hair and dander: Secondary | ICD-10-CM | POA: Diagnosis not present

## 2019-04-26 DIAGNOSIS — J3089 Other allergic rhinitis: Secondary | ICD-10-CM | POA: Diagnosis not present

## 2019-04-26 DIAGNOSIS — J3081 Allergic rhinitis due to animal (cat) (dog) hair and dander: Secondary | ICD-10-CM | POA: Diagnosis not present

## 2019-04-26 DIAGNOSIS — J301 Allergic rhinitis due to pollen: Secondary | ICD-10-CM | POA: Diagnosis not present

## 2019-05-03 DIAGNOSIS — J3089 Other allergic rhinitis: Secondary | ICD-10-CM | POA: Diagnosis not present

## 2019-05-03 DIAGNOSIS — J3081 Allergic rhinitis due to animal (cat) (dog) hair and dander: Secondary | ICD-10-CM | POA: Diagnosis not present

## 2019-05-03 DIAGNOSIS — J301 Allergic rhinitis due to pollen: Secondary | ICD-10-CM | POA: Diagnosis not present

## 2019-05-10 DIAGNOSIS — J301 Allergic rhinitis due to pollen: Secondary | ICD-10-CM | POA: Diagnosis not present

## 2019-05-10 DIAGNOSIS — J3081 Allergic rhinitis due to animal (cat) (dog) hair and dander: Secondary | ICD-10-CM | POA: Diagnosis not present

## 2019-05-10 DIAGNOSIS — J3089 Other allergic rhinitis: Secondary | ICD-10-CM | POA: Diagnosis not present

## 2019-05-17 DIAGNOSIS — J301 Allergic rhinitis due to pollen: Secondary | ICD-10-CM | POA: Diagnosis not present

## 2019-05-17 DIAGNOSIS — J3089 Other allergic rhinitis: Secondary | ICD-10-CM | POA: Diagnosis not present

## 2019-05-17 DIAGNOSIS — J3081 Allergic rhinitis due to animal (cat) (dog) hair and dander: Secondary | ICD-10-CM | POA: Diagnosis not present

## 2019-05-24 DIAGNOSIS — J3089 Other allergic rhinitis: Secondary | ICD-10-CM | POA: Diagnosis not present

## 2019-05-24 DIAGNOSIS — J301 Allergic rhinitis due to pollen: Secondary | ICD-10-CM | POA: Diagnosis not present

## 2019-05-24 DIAGNOSIS — J3081 Allergic rhinitis due to animal (cat) (dog) hair and dander: Secondary | ICD-10-CM | POA: Diagnosis not present

## 2019-05-31 DIAGNOSIS — J3089 Other allergic rhinitis: Secondary | ICD-10-CM | POA: Diagnosis not present

## 2019-05-31 DIAGNOSIS — J301 Allergic rhinitis due to pollen: Secondary | ICD-10-CM | POA: Diagnosis not present

## 2019-05-31 DIAGNOSIS — J3081 Allergic rhinitis due to animal (cat) (dog) hair and dander: Secondary | ICD-10-CM | POA: Diagnosis not present

## 2019-06-01 DIAGNOSIS — J301 Allergic rhinitis due to pollen: Secondary | ICD-10-CM | POA: Diagnosis not present

## 2019-06-02 DIAGNOSIS — J3089 Other allergic rhinitis: Secondary | ICD-10-CM | POA: Diagnosis not present

## 2019-06-02 DIAGNOSIS — J3081 Allergic rhinitis due to animal (cat) (dog) hair and dander: Secondary | ICD-10-CM | POA: Diagnosis not present

## 2019-06-07 DIAGNOSIS — J301 Allergic rhinitis due to pollen: Secondary | ICD-10-CM | POA: Diagnosis not present

## 2019-06-07 DIAGNOSIS — J3089 Other allergic rhinitis: Secondary | ICD-10-CM | POA: Diagnosis not present

## 2019-06-07 DIAGNOSIS — J3081 Allergic rhinitis due to animal (cat) (dog) hair and dander: Secondary | ICD-10-CM | POA: Diagnosis not present

## 2019-06-14 DIAGNOSIS — J301 Allergic rhinitis due to pollen: Secondary | ICD-10-CM | POA: Diagnosis not present

## 2019-06-14 DIAGNOSIS — J3081 Allergic rhinitis due to animal (cat) (dog) hair and dander: Secondary | ICD-10-CM | POA: Diagnosis not present

## 2019-06-14 DIAGNOSIS — J3089 Other allergic rhinitis: Secondary | ICD-10-CM | POA: Diagnosis not present

## 2019-06-16 DIAGNOSIS — J3089 Other allergic rhinitis: Secondary | ICD-10-CM | POA: Diagnosis not present

## 2019-06-16 DIAGNOSIS — J301 Allergic rhinitis due to pollen: Secondary | ICD-10-CM | POA: Diagnosis not present

## 2019-06-16 DIAGNOSIS — J3081 Allergic rhinitis due to animal (cat) (dog) hair and dander: Secondary | ICD-10-CM | POA: Diagnosis not present

## 2019-06-21 DIAGNOSIS — J3089 Other allergic rhinitis: Secondary | ICD-10-CM | POA: Diagnosis not present

## 2019-06-21 DIAGNOSIS — J3081 Allergic rhinitis due to animal (cat) (dog) hair and dander: Secondary | ICD-10-CM | POA: Diagnosis not present

## 2019-06-21 DIAGNOSIS — J301 Allergic rhinitis due to pollen: Secondary | ICD-10-CM | POA: Diagnosis not present

## 2019-06-23 DIAGNOSIS — J3081 Allergic rhinitis due to animal (cat) (dog) hair and dander: Secondary | ICD-10-CM | POA: Diagnosis not present

## 2019-06-23 DIAGNOSIS — J301 Allergic rhinitis due to pollen: Secondary | ICD-10-CM | POA: Diagnosis not present

## 2019-06-23 DIAGNOSIS — J3089 Other allergic rhinitis: Secondary | ICD-10-CM | POA: Diagnosis not present

## 2019-06-28 DIAGNOSIS — J301 Allergic rhinitis due to pollen: Secondary | ICD-10-CM | POA: Diagnosis not present

## 2019-06-28 DIAGNOSIS — J3081 Allergic rhinitis due to animal (cat) (dog) hair and dander: Secondary | ICD-10-CM | POA: Diagnosis not present

## 2019-06-28 DIAGNOSIS — J3089 Other allergic rhinitis: Secondary | ICD-10-CM | POA: Diagnosis not present

## 2019-07-05 DIAGNOSIS — J3089 Other allergic rhinitis: Secondary | ICD-10-CM | POA: Diagnosis not present

## 2019-07-05 DIAGNOSIS — J3081 Allergic rhinitis due to animal (cat) (dog) hair and dander: Secondary | ICD-10-CM | POA: Diagnosis not present

## 2019-07-05 DIAGNOSIS — J301 Allergic rhinitis due to pollen: Secondary | ICD-10-CM | POA: Diagnosis not present

## 2019-07-13 DIAGNOSIS — J301 Allergic rhinitis due to pollen: Secondary | ICD-10-CM | POA: Diagnosis not present

## 2019-07-13 DIAGNOSIS — J3089 Other allergic rhinitis: Secondary | ICD-10-CM | POA: Diagnosis not present

## 2019-07-13 DIAGNOSIS — J3081 Allergic rhinitis due to animal (cat) (dog) hair and dander: Secondary | ICD-10-CM | POA: Diagnosis not present

## 2019-07-19 DIAGNOSIS — J301 Allergic rhinitis due to pollen: Secondary | ICD-10-CM | POA: Diagnosis not present

## 2019-07-19 DIAGNOSIS — J3081 Allergic rhinitis due to animal (cat) (dog) hair and dander: Secondary | ICD-10-CM | POA: Diagnosis not present

## 2019-07-19 DIAGNOSIS — J3089 Other allergic rhinitis: Secondary | ICD-10-CM | POA: Diagnosis not present

## 2019-07-26 DIAGNOSIS — J3081 Allergic rhinitis due to animal (cat) (dog) hair and dander: Secondary | ICD-10-CM | POA: Diagnosis not present

## 2019-07-26 DIAGNOSIS — J3089 Other allergic rhinitis: Secondary | ICD-10-CM | POA: Diagnosis not present

## 2019-07-26 DIAGNOSIS — J301 Allergic rhinitis due to pollen: Secondary | ICD-10-CM | POA: Diagnosis not present

## 2019-08-02 DIAGNOSIS — J3089 Other allergic rhinitis: Secondary | ICD-10-CM | POA: Diagnosis not present

## 2019-08-02 DIAGNOSIS — J3081 Allergic rhinitis due to animal (cat) (dog) hair and dander: Secondary | ICD-10-CM | POA: Diagnosis not present

## 2019-08-02 DIAGNOSIS — J301 Allergic rhinitis due to pollen: Secondary | ICD-10-CM | POA: Diagnosis not present

## 2019-08-09 DIAGNOSIS — J301 Allergic rhinitis due to pollen: Secondary | ICD-10-CM | POA: Diagnosis not present

## 2019-08-09 DIAGNOSIS — J3089 Other allergic rhinitis: Secondary | ICD-10-CM | POA: Diagnosis not present

## 2019-08-09 DIAGNOSIS — J3081 Allergic rhinitis due to animal (cat) (dog) hair and dander: Secondary | ICD-10-CM | POA: Diagnosis not present

## 2019-08-16 DIAGNOSIS — J3089 Other allergic rhinitis: Secondary | ICD-10-CM | POA: Diagnosis not present

## 2019-08-16 DIAGNOSIS — J3081 Allergic rhinitis due to animal (cat) (dog) hair and dander: Secondary | ICD-10-CM | POA: Diagnosis not present

## 2019-08-16 DIAGNOSIS — J301 Allergic rhinitis due to pollen: Secondary | ICD-10-CM | POA: Diagnosis not present

## 2019-08-23 DIAGNOSIS — J3081 Allergic rhinitis due to animal (cat) (dog) hair and dander: Secondary | ICD-10-CM | POA: Diagnosis not present

## 2019-08-23 DIAGNOSIS — J3089 Other allergic rhinitis: Secondary | ICD-10-CM | POA: Diagnosis not present

## 2019-08-23 DIAGNOSIS — J301 Allergic rhinitis due to pollen: Secondary | ICD-10-CM | POA: Diagnosis not present

## 2019-08-25 ENCOUNTER — Ambulatory Visit: Payer: Self-pay

## 2019-08-26 ENCOUNTER — Ambulatory Visit: Payer: Self-pay

## 2019-08-29 DIAGNOSIS — J301 Allergic rhinitis due to pollen: Secondary | ICD-10-CM | POA: Diagnosis not present

## 2019-08-29 DIAGNOSIS — J3089 Other allergic rhinitis: Secondary | ICD-10-CM | POA: Diagnosis not present

## 2019-08-29 DIAGNOSIS — J3081 Allergic rhinitis due to animal (cat) (dog) hair and dander: Secondary | ICD-10-CM | POA: Diagnosis not present

## 2019-09-01 ENCOUNTER — Ambulatory Visit (INDEPENDENT_AMBULATORY_CARE_PROVIDER_SITE_OTHER): Payer: BC Managed Care – PPO | Admitting: Family Medicine

## 2019-09-01 ENCOUNTER — Other Ambulatory Visit: Payer: Self-pay

## 2019-09-01 DIAGNOSIS — Z13220 Encounter for screening for lipoid disorders: Secondary | ICD-10-CM | POA: Diagnosis not present

## 2019-09-01 DIAGNOSIS — Z114 Encounter for screening for human immunodeficiency virus [HIV]: Secondary | ICD-10-CM | POA: Diagnosis not present

## 2019-09-01 DIAGNOSIS — Z1329 Encounter for screening for other suspected endocrine disorder: Secondary | ICD-10-CM

## 2019-09-01 DIAGNOSIS — Z13228 Encounter for screening for other metabolic disorders: Secondary | ICD-10-CM | POA: Diagnosis not present

## 2019-09-02 ENCOUNTER — Encounter: Payer: Self-pay | Admitting: Family Medicine

## 2019-09-02 ENCOUNTER — Ambulatory Visit (INDEPENDENT_AMBULATORY_CARE_PROVIDER_SITE_OTHER): Payer: BC Managed Care – PPO | Admitting: Family Medicine

## 2019-09-02 VITALS — BP 116/75 | HR 65 | Temp 98.0°F | Ht 69.0 in | Wt 247.0 lb

## 2019-09-02 DIAGNOSIS — R739 Hyperglycemia, unspecified: Secondary | ICD-10-CM | POA: Diagnosis not present

## 2019-09-02 DIAGNOSIS — Z0000 Encounter for general adult medical examination without abnormal findings: Secondary | ICD-10-CM

## 2019-09-02 DIAGNOSIS — Z0001 Encounter for general adult medical examination with abnormal findings: Secondary | ICD-10-CM

## 2019-09-02 DIAGNOSIS — E7800 Pure hypercholesterolemia, unspecified: Secondary | ICD-10-CM | POA: Diagnosis not present

## 2019-09-02 DIAGNOSIS — H9193 Unspecified hearing loss, bilateral: Secondary | ICD-10-CM

## 2019-09-02 DIAGNOSIS — Z833 Family history of diabetes mellitus: Secondary | ICD-10-CM

## 2019-09-02 LAB — CMP14+EGFR
ALT: 30 IU/L (ref 0–44)
AST: 23 IU/L (ref 0–40)
Albumin/Globulin Ratio: 1.7 (ref 1.2–2.2)
Albumin: 4.7 g/dL (ref 4.0–5.0)
Alkaline Phosphatase: 56 IU/L (ref 48–121)
BUN/Creatinine Ratio: 9 (ref 9–20)
BUN: 12 mg/dL (ref 6–24)
Bilirubin Total: 0.6 mg/dL (ref 0.0–1.2)
CO2: 25 mmol/L (ref 20–29)
Calcium: 9.8 mg/dL (ref 8.7–10.2)
Chloride: 101 mmol/L (ref 96–106)
Creatinine, Ser: 1.3 mg/dL — ABNORMAL HIGH (ref 0.76–1.27)
GFR calc Af Amer: 77 mL/min/{1.73_m2} (ref >59)
GFR calc non Af Amer: 66 mL/min/{1.73_m2} (ref >59)
Globulin, Total: 2.8 g/dL (ref 1.5–4.5)
Glucose: 108 mg/dL — ABNORMAL HIGH (ref 65–99)
Potassium: 4.4 mmol/L (ref 3.5–5.2)
Sodium: 137 mmol/L (ref 134–144)
Total Protein: 7.5 g/dL (ref 6.0–8.5)

## 2019-09-02 LAB — TSH: TSH: 3.35 u[IU]/mL (ref 0.450–4.500)

## 2019-09-02 LAB — LIPID PANEL
Chol/HDL Ratio: 5.1 ratio — ABNORMAL HIGH (ref 0.0–5.0)
Cholesterol, Total: 213 mg/dL — ABNORMAL HIGH (ref 100–199)
HDL: 42 mg/dL (ref >39)
LDL Chol Calc (NIH): 156 mg/dL — ABNORMAL HIGH (ref 0–99)
Triglycerides: 81 mg/dL (ref 0–149)
VLDL Cholesterol Cal: 15 mg/dL (ref 5–40)

## 2019-09-02 LAB — POCT GLYCOSYLATED HEMOGLOBIN (HGB A1C): Hemoglobin A1C: 5.7 % — AB (ref 4.0–5.6)

## 2019-09-02 LAB — HIV ANTIBODY (ROUTINE TESTING W REFLEX): HIV Screen 4th Generation wRfx: NONREACTIVE

## 2019-09-02 NOTE — Patient Instructions (Addendum)
° °  As discussed, keep your weight down.  We will let you know the results of the lab, you can.  Return as needed    If you have lab work done today you will be contacted with your lab results within the next 2 weeks.  If you have not heard from [place] then please contact us. The fastest way to get your results is to register for My Chart.   IF you received an x-ray today, you will receive an invoice from [facility]. Please contact [facility] at [phone] with questions or concerns regarding your invoice.   IF you received labwork today, you will receive an invoice from [facility]. Please contact [facility] at [phone] with questions or concerns regarding your invoice.   Our billing staff will not be able to assist you with questions regarding bills from these companies.  You will be contacted with the lab results as soon as they are available. The fastest way to get your results is to activate your My Chart account. Instructions are located on the last page of this paperwork. If you have not heard from [place] regarding the results in 2 weeks, please contact this office.

## 2019-09-02 NOTE — Progress Notes (Signed)
Call patient:Labs normal except for a slightly high cholesterol.  No new treatments at this time.  Johnny Barber. Alwyn Ren, MD

## 2019-09-02 NOTE — Progress Notes (Signed)
Janace Hoard, MD 8/20/2021Patient ID: Johnny Barber, male    DOB: September 11, 1975  Age: 44 y.o. MRN: [id number]  Chief Complaint  Patient presents with  . Annual Exam    sees allergist weekly for allergy shots    Subjective:   Annual physical examination  Patient is here for annual physical exam.  He has no major acute complaints.  Past medical history: Operations none Major illnesses: None Allergies: None Regular medications: None  Family history: Father is 63 diabetic and prostate disease Mother is 70 has hypertension He has 6 siblings no major femoral disease noted  Social history: Patient lives with the mother of his children.  Has 3 children.  He does some walking and activity and plays with the kids.  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  Review of systems Constitutional: Unremarkable HEENT: Unremarkable.  He does think his hearing is not as good as it used to be Cardiovascular: Unremarkable Respiratory: Unremarkable GI: Unremarkable GU: Unremarkable Musculoskeletal: Unremarkable Dermatologic: Neurologic: Unremarkable Psychiatric: Unremarkable Endocrine-: Unremarkable    Current allergies, medications, problem list, past/family and social histories reviewed.  Objective:  BP 116/75   Pulse 65   Temp 98 F (36.7 C)   Ht 5\' 9"  (1.753 m)   Wt 247 lb (112 kg)   SpO2 97%   BMI 36.48 kg/m   Heavyset male no acute distress.  TMs appear normal.  Throat clear.  Neck supple without nodes or thyromegaly.  Has braces on his lower teeth.  Chest clear posteriorly.  Heart rate without murmurs gallops or arrhythmias.  Lungs [or tenderness.  Normal male external genitalia.  No hernias.  Extremities unremarkable.  Skin unremarkable.    Labs from yesterday were normal except for slightly high cholesterol. Assessment & Plan:   Assessment: 1. Annual physical exam   2. Family history of diabetes mellitus   3. Decreased hearing of both ears   4. Pure  hypercholesterolemia       Plan: See instructions  No orders of the defined types were placed in this encounter.   No orders of the defined types were placed in this encounter.        Patient Instructions     As discussed, keep your weight down.  We will let you know the results of the lab, you can.  Return as needed    If you have lab work done today you will be contacted with your lab results within the next 2 weeks.  If you have not heard from then please contact us. The fastest way to get your results is to register for My Chart.   IF you received an x-ray today, you will receive an invoice from [facility]. Please contact [facility] at [phone] with questions or concerns regarding your invoice.   IF you received labwork today, you will receive an invoice from [facility]. Please contact [facility] at [phone] with questions or concerns regarding your invoice.   Our billing staff will not be able to assist you with questions regarding bills from these companies.  You will be contacted with the lab results as soon as they are available. The fastest way to get your results is to activate your My Chart account. Instructions are located on the last page of this paperwork. If you have not heard from [phone] regarding the results in 2 weeks, please contact this office.         Return if symptoms worsen or fail to improve.   [place]  Alwyn Ren, MD 09/02/2019

## 2019-09-06 DIAGNOSIS — J3081 Allergic rhinitis due to animal (cat) (dog) hair and dander: Secondary | ICD-10-CM | POA: Diagnosis not present

## 2019-09-06 DIAGNOSIS — J301 Allergic rhinitis due to pollen: Secondary | ICD-10-CM | POA: Diagnosis not present

## 2019-09-06 DIAGNOSIS — J3089 Other allergic rhinitis: Secondary | ICD-10-CM | POA: Diagnosis not present

## 2019-09-13 DIAGNOSIS — J3081 Allergic rhinitis due to animal (cat) (dog) hair and dander: Secondary | ICD-10-CM | POA: Diagnosis not present

## 2019-09-13 DIAGNOSIS — J3089 Other allergic rhinitis: Secondary | ICD-10-CM | POA: Diagnosis not present

## 2019-09-13 DIAGNOSIS — J301 Allergic rhinitis due to pollen: Secondary | ICD-10-CM | POA: Diagnosis not present

## 2019-09-20 DIAGNOSIS — J3089 Other allergic rhinitis: Secondary | ICD-10-CM | POA: Diagnosis not present

## 2019-09-20 DIAGNOSIS — J301 Allergic rhinitis due to pollen: Secondary | ICD-10-CM | POA: Diagnosis not present

## 2019-09-20 DIAGNOSIS — J3081 Allergic rhinitis due to animal (cat) (dog) hair and dander: Secondary | ICD-10-CM | POA: Diagnosis not present

## 2019-09-27 DIAGNOSIS — J3089 Other allergic rhinitis: Secondary | ICD-10-CM | POA: Diagnosis not present

## 2019-09-27 DIAGNOSIS — J301 Allergic rhinitis due to pollen: Secondary | ICD-10-CM | POA: Diagnosis not present

## 2019-09-27 DIAGNOSIS — J3081 Allergic rhinitis due to animal (cat) (dog) hair and dander: Secondary | ICD-10-CM | POA: Diagnosis not present

## 2019-10-04 DIAGNOSIS — J301 Allergic rhinitis due to pollen: Secondary | ICD-10-CM | POA: Diagnosis not present

## 2019-10-04 DIAGNOSIS — J3081 Allergic rhinitis due to animal (cat) (dog) hair and dander: Secondary | ICD-10-CM | POA: Diagnosis not present

## 2019-10-04 DIAGNOSIS — J3089 Other allergic rhinitis: Secondary | ICD-10-CM | POA: Diagnosis not present

## 2019-10-11 DIAGNOSIS — J3089 Other allergic rhinitis: Secondary | ICD-10-CM | POA: Diagnosis not present

## 2019-10-11 DIAGNOSIS — J3081 Allergic rhinitis due to animal (cat) (dog) hair and dander: Secondary | ICD-10-CM | POA: Diagnosis not present

## 2019-10-11 DIAGNOSIS — J301 Allergic rhinitis due to pollen: Secondary | ICD-10-CM | POA: Diagnosis not present

## 2019-10-17 DIAGNOSIS — J301 Allergic rhinitis due to pollen: Secondary | ICD-10-CM | POA: Diagnosis not present

## 2019-10-18 DIAGNOSIS — J3081 Allergic rhinitis due to animal (cat) (dog) hair and dander: Secondary | ICD-10-CM | POA: Diagnosis not present

## 2019-10-18 DIAGNOSIS — J3089 Other allergic rhinitis: Secondary | ICD-10-CM | POA: Diagnosis not present

## 2019-10-18 DIAGNOSIS — J301 Allergic rhinitis due to pollen: Secondary | ICD-10-CM | POA: Diagnosis not present

## 2019-10-25 DIAGNOSIS — J301 Allergic rhinitis due to pollen: Secondary | ICD-10-CM | POA: Diagnosis not present

## 2019-10-25 DIAGNOSIS — J3081 Allergic rhinitis due to animal (cat) (dog) hair and dander: Secondary | ICD-10-CM | POA: Diagnosis not present

## 2019-10-25 DIAGNOSIS — J3089 Other allergic rhinitis: Secondary | ICD-10-CM | POA: Diagnosis not present

## 2019-11-01 DIAGNOSIS — J3081 Allergic rhinitis due to animal (cat) (dog) hair and dander: Secondary | ICD-10-CM | POA: Diagnosis not present

## 2019-11-01 DIAGNOSIS — J3089 Other allergic rhinitis: Secondary | ICD-10-CM | POA: Diagnosis not present

## 2019-11-01 DIAGNOSIS — J301 Allergic rhinitis due to pollen: Secondary | ICD-10-CM | POA: Diagnosis not present

## 2019-11-08 DIAGNOSIS — J3081 Allergic rhinitis due to animal (cat) (dog) hair and dander: Secondary | ICD-10-CM | POA: Diagnosis not present

## 2019-11-08 DIAGNOSIS — J301 Allergic rhinitis due to pollen: Secondary | ICD-10-CM | POA: Diagnosis not present

## 2019-11-08 DIAGNOSIS — J3089 Other allergic rhinitis: Secondary | ICD-10-CM | POA: Diagnosis not present

## 2019-11-10 DIAGNOSIS — J3089 Other allergic rhinitis: Secondary | ICD-10-CM | POA: Diagnosis not present

## 2019-11-10 DIAGNOSIS — J3081 Allergic rhinitis due to animal (cat) (dog) hair and dander: Secondary | ICD-10-CM | POA: Diagnosis not present

## 2019-11-10 DIAGNOSIS — J301 Allergic rhinitis due to pollen: Secondary | ICD-10-CM | POA: Diagnosis not present

## 2019-11-15 DIAGNOSIS — J3089 Other allergic rhinitis: Secondary | ICD-10-CM | POA: Diagnosis not present

## 2019-11-15 DIAGNOSIS — J3081 Allergic rhinitis due to animal (cat) (dog) hair and dander: Secondary | ICD-10-CM | POA: Diagnosis not present

## 2019-11-15 DIAGNOSIS — J301 Allergic rhinitis due to pollen: Secondary | ICD-10-CM | POA: Diagnosis not present

## 2019-11-17 DIAGNOSIS — J3081 Allergic rhinitis due to animal (cat) (dog) hair and dander: Secondary | ICD-10-CM | POA: Diagnosis not present

## 2019-11-17 DIAGNOSIS — J3089 Other allergic rhinitis: Secondary | ICD-10-CM | POA: Diagnosis not present

## 2019-11-17 DIAGNOSIS — J301 Allergic rhinitis due to pollen: Secondary | ICD-10-CM | POA: Diagnosis not present

## 2019-11-21 DIAGNOSIS — J301 Allergic rhinitis due to pollen: Secondary | ICD-10-CM | POA: Diagnosis not present

## 2019-11-21 DIAGNOSIS — J3089 Other allergic rhinitis: Secondary | ICD-10-CM | POA: Diagnosis not present

## 2019-11-21 DIAGNOSIS — J3081 Allergic rhinitis due to animal (cat) (dog) hair and dander: Secondary | ICD-10-CM | POA: Diagnosis not present

## 2019-11-28 DIAGNOSIS — J3081 Allergic rhinitis due to animal (cat) (dog) hair and dander: Secondary | ICD-10-CM | POA: Diagnosis not present

## 2019-11-28 DIAGNOSIS — J301 Allergic rhinitis due to pollen: Secondary | ICD-10-CM | POA: Diagnosis not present

## 2019-11-28 DIAGNOSIS — J3089 Other allergic rhinitis: Secondary | ICD-10-CM | POA: Diagnosis not present

## 2019-12-05 DIAGNOSIS — J3089 Other allergic rhinitis: Secondary | ICD-10-CM | POA: Diagnosis not present

## 2019-12-05 DIAGNOSIS — J301 Allergic rhinitis due to pollen: Secondary | ICD-10-CM | POA: Diagnosis not present

## 2019-12-05 DIAGNOSIS — J3081 Allergic rhinitis due to animal (cat) (dog) hair and dander: Secondary | ICD-10-CM | POA: Diagnosis not present

## 2019-12-12 DIAGNOSIS — J301 Allergic rhinitis due to pollen: Secondary | ICD-10-CM | POA: Diagnosis not present

## 2019-12-12 DIAGNOSIS — J3081 Allergic rhinitis due to animal (cat) (dog) hair and dander: Secondary | ICD-10-CM | POA: Diagnosis not present

## 2019-12-12 DIAGNOSIS — J3089 Other allergic rhinitis: Secondary | ICD-10-CM | POA: Diagnosis not present

## 2019-12-19 DIAGNOSIS — J3081 Allergic rhinitis due to animal (cat) (dog) hair and dander: Secondary | ICD-10-CM | POA: Diagnosis not present

## 2019-12-19 DIAGNOSIS — J301 Allergic rhinitis due to pollen: Secondary | ICD-10-CM | POA: Diagnosis not present

## 2019-12-19 DIAGNOSIS — J3089 Other allergic rhinitis: Secondary | ICD-10-CM | POA: Diagnosis not present

## 2019-12-30 DIAGNOSIS — J301 Allergic rhinitis due to pollen: Secondary | ICD-10-CM | POA: Diagnosis not present

## 2019-12-30 DIAGNOSIS — J3081 Allergic rhinitis due to animal (cat) (dog) hair and dander: Secondary | ICD-10-CM | POA: Diagnosis not present

## 2019-12-30 DIAGNOSIS — J3089 Other allergic rhinitis: Secondary | ICD-10-CM | POA: Diagnosis not present

## 2020-01-02 DIAGNOSIS — J301 Allergic rhinitis due to pollen: Secondary | ICD-10-CM | POA: Diagnosis not present

## 2020-01-02 DIAGNOSIS — J3089 Other allergic rhinitis: Secondary | ICD-10-CM | POA: Diagnosis not present

## 2020-01-02 DIAGNOSIS — J3081 Allergic rhinitis due to animal (cat) (dog) hair and dander: Secondary | ICD-10-CM | POA: Diagnosis not present

## 2020-01-10 DIAGNOSIS — J3089 Other allergic rhinitis: Secondary | ICD-10-CM | POA: Diagnosis not present

## 2020-01-10 DIAGNOSIS — J3081 Allergic rhinitis due to animal (cat) (dog) hair and dander: Secondary | ICD-10-CM | POA: Diagnosis not present

## 2020-01-10 DIAGNOSIS — J301 Allergic rhinitis due to pollen: Secondary | ICD-10-CM | POA: Diagnosis not present

## 2020-01-17 DIAGNOSIS — J3089 Other allergic rhinitis: Secondary | ICD-10-CM | POA: Diagnosis not present

## 2020-01-17 DIAGNOSIS — J301 Allergic rhinitis due to pollen: Secondary | ICD-10-CM | POA: Diagnosis not present

## 2020-01-17 DIAGNOSIS — J3081 Allergic rhinitis due to animal (cat) (dog) hair and dander: Secondary | ICD-10-CM | POA: Diagnosis not present

## 2020-01-23 DIAGNOSIS — J301 Allergic rhinitis due to pollen: Secondary | ICD-10-CM | POA: Diagnosis not present

## 2020-01-23 DIAGNOSIS — J3081 Allergic rhinitis due to animal (cat) (dog) hair and dander: Secondary | ICD-10-CM | POA: Diagnosis not present

## 2020-01-23 DIAGNOSIS — J3089 Other allergic rhinitis: Secondary | ICD-10-CM | POA: Diagnosis not present

## 2020-01-31 DIAGNOSIS — J3081 Allergic rhinitis due to animal (cat) (dog) hair and dander: Secondary | ICD-10-CM | POA: Diagnosis not present

## 2020-01-31 DIAGNOSIS — J3089 Other allergic rhinitis: Secondary | ICD-10-CM | POA: Diagnosis not present

## 2020-01-31 DIAGNOSIS — J301 Allergic rhinitis due to pollen: Secondary | ICD-10-CM | POA: Diagnosis not present

## 2020-02-06 DIAGNOSIS — J3089 Other allergic rhinitis: Secondary | ICD-10-CM | POA: Diagnosis not present

## 2020-02-06 DIAGNOSIS — J301 Allergic rhinitis due to pollen: Secondary | ICD-10-CM | POA: Diagnosis not present

## 2020-02-06 DIAGNOSIS — J3081 Allergic rhinitis due to animal (cat) (dog) hair and dander: Secondary | ICD-10-CM | POA: Diagnosis not present

## 2020-02-14 DIAGNOSIS — J3089 Other allergic rhinitis: Secondary | ICD-10-CM | POA: Diagnosis not present

## 2020-02-14 DIAGNOSIS — J301 Allergic rhinitis due to pollen: Secondary | ICD-10-CM | POA: Diagnosis not present

## 2020-02-14 DIAGNOSIS — J3081 Allergic rhinitis due to animal (cat) (dog) hair and dander: Secondary | ICD-10-CM | POA: Diagnosis not present

## 2020-02-20 DIAGNOSIS — J301 Allergic rhinitis due to pollen: Secondary | ICD-10-CM | POA: Diagnosis not present

## 2020-02-20 DIAGNOSIS — J3089 Other allergic rhinitis: Secondary | ICD-10-CM | POA: Diagnosis not present

## 2020-02-20 DIAGNOSIS — J3081 Allergic rhinitis due to animal (cat) (dog) hair and dander: Secondary | ICD-10-CM | POA: Diagnosis not present

## 2020-02-28 DIAGNOSIS — J3089 Other allergic rhinitis: Secondary | ICD-10-CM | POA: Diagnosis not present

## 2020-02-28 DIAGNOSIS — J3081 Allergic rhinitis due to animal (cat) (dog) hair and dander: Secondary | ICD-10-CM | POA: Diagnosis not present

## 2020-02-28 DIAGNOSIS — J301 Allergic rhinitis due to pollen: Secondary | ICD-10-CM | POA: Diagnosis not present

## 2020-03-05 DIAGNOSIS — J3089 Other allergic rhinitis: Secondary | ICD-10-CM | POA: Diagnosis not present

## 2020-03-05 DIAGNOSIS — J301 Allergic rhinitis due to pollen: Secondary | ICD-10-CM | POA: Diagnosis not present

## 2020-03-05 DIAGNOSIS — J3081 Allergic rhinitis due to animal (cat) (dog) hair and dander: Secondary | ICD-10-CM | POA: Diagnosis not present

## 2020-03-13 DIAGNOSIS — J3089 Other allergic rhinitis: Secondary | ICD-10-CM | POA: Diagnosis not present

## 2020-03-13 DIAGNOSIS — J3081 Allergic rhinitis due to animal (cat) (dog) hair and dander: Secondary | ICD-10-CM | POA: Diagnosis not present

## 2020-03-13 DIAGNOSIS — J301 Allergic rhinitis due to pollen: Secondary | ICD-10-CM | POA: Diagnosis not present

## 2020-03-19 DIAGNOSIS — J301 Allergic rhinitis due to pollen: Secondary | ICD-10-CM | POA: Diagnosis not present

## 2020-03-19 DIAGNOSIS — J3089 Other allergic rhinitis: Secondary | ICD-10-CM | POA: Diagnosis not present

## 2020-03-19 DIAGNOSIS — J3081 Allergic rhinitis due to animal (cat) (dog) hair and dander: Secondary | ICD-10-CM | POA: Diagnosis not present

## 2020-03-27 DIAGNOSIS — J3089 Other allergic rhinitis: Secondary | ICD-10-CM | POA: Diagnosis not present

## 2020-03-27 DIAGNOSIS — J301 Allergic rhinitis due to pollen: Secondary | ICD-10-CM | POA: Diagnosis not present

## 2020-03-27 DIAGNOSIS — J3081 Allergic rhinitis due to animal (cat) (dog) hair and dander: Secondary | ICD-10-CM | POA: Diagnosis not present

## 2020-04-02 DIAGNOSIS — J3089 Other allergic rhinitis: Secondary | ICD-10-CM | POA: Diagnosis not present

## 2020-04-02 DIAGNOSIS — J3081 Allergic rhinitis due to animal (cat) (dog) hair and dander: Secondary | ICD-10-CM | POA: Diagnosis not present

## 2020-04-02 DIAGNOSIS — J301 Allergic rhinitis due to pollen: Secondary | ICD-10-CM | POA: Diagnosis not present

## 2020-04-04 DIAGNOSIS — J3081 Allergic rhinitis due to animal (cat) (dog) hair and dander: Secondary | ICD-10-CM | POA: Diagnosis not present

## 2020-04-04 DIAGNOSIS — J301 Allergic rhinitis due to pollen: Secondary | ICD-10-CM | POA: Diagnosis not present

## 2020-04-04 DIAGNOSIS — J3089 Other allergic rhinitis: Secondary | ICD-10-CM | POA: Diagnosis not present

## 2020-04-09 DIAGNOSIS — J3089 Other allergic rhinitis: Secondary | ICD-10-CM | POA: Diagnosis not present

## 2020-04-09 DIAGNOSIS — J3081 Allergic rhinitis due to animal (cat) (dog) hair and dander: Secondary | ICD-10-CM | POA: Diagnosis not present

## 2020-04-09 DIAGNOSIS — J301 Allergic rhinitis due to pollen: Secondary | ICD-10-CM | POA: Diagnosis not present

## 2020-04-11 DIAGNOSIS — J301 Allergic rhinitis due to pollen: Secondary | ICD-10-CM | POA: Diagnosis not present

## 2020-04-11 DIAGNOSIS — J3089 Other allergic rhinitis: Secondary | ICD-10-CM | POA: Diagnosis not present

## 2020-04-11 DIAGNOSIS — J3081 Allergic rhinitis due to animal (cat) (dog) hair and dander: Secondary | ICD-10-CM | POA: Diagnosis not present

## 2020-04-16 DIAGNOSIS — J301 Allergic rhinitis due to pollen: Secondary | ICD-10-CM | POA: Diagnosis not present

## 2020-04-16 DIAGNOSIS — J3081 Allergic rhinitis due to animal (cat) (dog) hair and dander: Secondary | ICD-10-CM | POA: Diagnosis not present

## 2020-04-16 DIAGNOSIS — J3089 Other allergic rhinitis: Secondary | ICD-10-CM | POA: Diagnosis not present

## 2020-04-24 DIAGNOSIS — J3081 Allergic rhinitis due to animal (cat) (dog) hair and dander: Secondary | ICD-10-CM | POA: Diagnosis not present

## 2020-04-24 DIAGNOSIS — J3089 Other allergic rhinitis: Secondary | ICD-10-CM | POA: Diagnosis not present

## 2020-04-24 DIAGNOSIS — J301 Allergic rhinitis due to pollen: Secondary | ICD-10-CM | POA: Diagnosis not present

## 2020-05-01 DIAGNOSIS — J3089 Other allergic rhinitis: Secondary | ICD-10-CM | POA: Diagnosis not present

## 2020-05-01 DIAGNOSIS — J3081 Allergic rhinitis due to animal (cat) (dog) hair and dander: Secondary | ICD-10-CM | POA: Diagnosis not present

## 2020-05-01 DIAGNOSIS — J301 Allergic rhinitis due to pollen: Secondary | ICD-10-CM | POA: Diagnosis not present

## 2020-05-08 DIAGNOSIS — J3081 Allergic rhinitis due to animal (cat) (dog) hair and dander: Secondary | ICD-10-CM | POA: Diagnosis not present

## 2020-05-08 DIAGNOSIS — J301 Allergic rhinitis due to pollen: Secondary | ICD-10-CM | POA: Diagnosis not present

## 2020-05-08 DIAGNOSIS — J3089 Other allergic rhinitis: Secondary | ICD-10-CM | POA: Diagnosis not present

## 2020-05-14 DIAGNOSIS — J3089 Other allergic rhinitis: Secondary | ICD-10-CM | POA: Diagnosis not present

## 2020-05-14 DIAGNOSIS — J3081 Allergic rhinitis due to animal (cat) (dog) hair and dander: Secondary | ICD-10-CM | POA: Diagnosis not present

## 2020-05-14 DIAGNOSIS — J301 Allergic rhinitis due to pollen: Secondary | ICD-10-CM | POA: Diagnosis not present

## 2020-05-23 DIAGNOSIS — J301 Allergic rhinitis due to pollen: Secondary | ICD-10-CM | POA: Diagnosis not present

## 2020-05-23 DIAGNOSIS — J3081 Allergic rhinitis due to animal (cat) (dog) hair and dander: Secondary | ICD-10-CM | POA: Diagnosis not present

## 2020-05-23 DIAGNOSIS — J3089 Other allergic rhinitis: Secondary | ICD-10-CM | POA: Diagnosis not present

## 2020-05-29 DIAGNOSIS — J3081 Allergic rhinitis due to animal (cat) (dog) hair and dander: Secondary | ICD-10-CM | POA: Diagnosis not present

## 2020-05-29 DIAGNOSIS — J301 Allergic rhinitis due to pollen: Secondary | ICD-10-CM | POA: Diagnosis not present

## 2020-05-29 DIAGNOSIS — J3089 Other allergic rhinitis: Secondary | ICD-10-CM | POA: Diagnosis not present

## 2020-05-31 ENCOUNTER — Other Ambulatory Visit: Payer: Self-pay

## 2020-05-31 ENCOUNTER — Encounter: Payer: Self-pay | Admitting: Emergency Medicine

## 2020-05-31 ENCOUNTER — Ambulatory Visit
Admission: EM | Admit: 2020-05-31 | Discharge: 2020-05-31 | Disposition: A | Discharge status: Home / Self Care | Payer: BC Managed Care – PPO | Attending: Internal Medicine | Admitting: Internal Medicine

## 2020-05-31 DIAGNOSIS — M545 Low back pain, unspecified: Secondary | ICD-10-CM

## 2020-05-31 MED ORDER — CYCLOBENZAPRINE HCL 10 MG PO TABS: 10.0000 mg | ORAL_TABLET | Freq: Three times a day (TID) | ORAL | 0 refills | Status: AC

## 2020-05-31 MED ORDER — KETOROLAC TROMETHAMINE 10 MG PO TABS: 10.0000 mg | ORAL_TABLET | Freq: Four times a day (QID) | ORAL | 0 refills | Status: AC | PRN

## 2020-05-31 NOTE — ED Triage Notes (Signed)
Pt said x 2 days has been having lower back pain. No injury, no urination issues. Pt sais lying on his side makes it feel better.

## 2020-05-31 NOTE — Discharge Instructions (Signed)
Try ice and heat on area of pain for 15 minutes each for 5-7 days I am placing you on a Muscle relaxer and stronger Ibuprofen for pain and inflammation  Please follow up with primary care doctor, so if you are not better they can refer you to physical therapy

## 2020-05-31 NOTE — ED Provider Notes (Signed)
[facility]    CSN: [id number] Arrival date & time: 05/31/20  0847      History   Chief Complaint Chief Complaint  Patient presents with  . Back Pain    HPI Johnny Barber is a 45 y.o. male  Who presents with L lower lumbar backpain from midline to left, but does not radiate to legs. Denies paresthesia, UTI or abd pain. This pain started when he bent over to brush his teeth, was sudden and lost strength on his legs and went down the floor. Pain is provoked with any kind of spine movement and deep breaths . Pain is better with rest or sitting or laying still. Denies pain radiating to legs. Denies urinary or bowel incontinence. Denies hx of disc disease. Denies paresthesia to legs. His PCP's clinic has closed.       Past Medical History:  Diagnosis Date  . Allergy   . Asthma    last asthma flare 17 yr ago     There are no problems to display for this patient.   History reviewed. No pertinent surgical history.     Home Medications    Prior to Admission medications   Medication Sig Start Date End Date Taking? Authorizing Provider  cyclobenzaprine (FLEXERIL) 10 MG tablet Take 1 tablet (10 mg total) by mouth 3 (three) times daily. 05/31/20  Yes Rodriguez-Southworth, Nettie Elm, PA-C  ketorolac (TORADOL) 10 MG tablet Take 1 tablet (10 mg total) by mouth every 6 (six) hours as needed. 05/31/20  Yes Rodriguez-Southworth, Nettie Elm, PA-C    Family History Family History  Problem Relation Age of Onset  . Hypertension Mother   . Diabetes Father     Social History Social History   Tobacco Use  . Smoking status: Never Smoker  . Smokeless tobacco: Never Used  Substance Use Topics  . Alcohol use: Yes    Comment: occ   . Drug use: No     Allergies   Dust mite mixed allergen ext [mite (d. farinae)], Mixed grasses, and Peanut-containing drug products   Review of Systems Review of Systems  Gastrointestinal: Negative for abdominal pain.  Genitourinary: Negative  for difficulty urinating.  Musculoskeletal: Positive for back pain. Negative for gait problem and myalgias.  Skin: Negative for color change, rash and wound.  Neurological: Negative for weakness and numbness.     Physical Exam Triage Vital Signs ED Triage Vitals  Enc Vitals Group     BP 05/31/20 0911 131/85     Pulse Rate 05/31/20 0911 62     Resp 05/31/20 0911 16     Temp 05/31/20 0911 98.6 F (37 C)     Temp Source 05/31/20 0911 Oral     SpO2 05/31/20 0911 96 %     Weight --      Height --      Head Circumference --      Peak Flow --      Pain Score 05/31/20 0912 8     Pain Loc --      Pain Edu? --      Excl. in GC? --    No data found.  Updated Vital Signs BP 131/85 (BP Location: Right Arm)   Pulse 62   Temp 98.6 F (37 C) (Oral)   Resp 16   SpO2 96%   Visual Acuity Right Eye Distance:   Left Eye Distance:   Bilateral Distance:    Right Eye Near:   Left Eye Near:    Bilateral  Near:     Physical Exam Constitutional:      General: He is not in acute distress.    Appearance: He is obese. He is not toxic-appearing.  HENT:     Head: Normocephalic.     Right Ear: External ear normal.     Left Ear: External ear normal.  Eyes:     Conjunctiva/sclera: Conjunctivae normal.  Cardiovascular:     Rate and Rhythm: Normal rate and regular rhythm.  Pulmonary:     Effort: Pulmonary effort is normal.     Breath sounds: Normal breath sounds.  Abdominal:     General: Bowel sounds are normal.     Palpations: Abdomen is soft.     Tenderness: There is no abdominal tenderness.  Musculoskeletal:        General: Normal range of motion.     Cervical back: Neck supple.     Comments: BACK- unable to provoke pain with palpation, pain was provoked with L lateral flexion, anterior flexion to 40 degrees and raising up from anterior flexion. Could not posteriorly extend due to pain. Did not have vertebral tenderness or lumbar spine.   NEG SLR  Skin:    General: Skin is warm  and dry.     Findings: No rash.  Neurological:     Mental Status: He is alert and oriented to person, place, and time.     Motor: No weakness.     Gait: Gait normal.     Deep Tendon Reflexes: Reflexes normal.  Psychiatric:        Mood and Affect: Mood normal.        Behavior: Behavior normal.        Thought Content: Thought content normal.        Judgment: Judgment normal.    UC Treatments / Results  Labs (all labs ordered are listed, but only abnormal results are displayed) Labs Reviewed - No data to display  EKG   Radiology No results found.  Procedures Procedures (including critical care time)  Medications Ordered in UC Medications - No data to display  Initial Impression / Assessment and Plan / UC Course  I have reviewed the triage vital signs and the nursing notes. Lumbar strain of unknown cause with neg neuro signs.  I placed him on Toradol PO and Flexeril as noted. See instructions.  Final Clinical Impressions(s) / UC Diagnoses   Final diagnoses:  Acute left-sided low back pain without sciatica     Discharge Instructions     Try ice and heat on area of pain for 15 minutes each for 5-7 days I am placing you on a Muscle relaxer and stronger Ibuprofen for pain and inflammation  Please follow up with primary care doctor, so if you are not better they can refer you to physical therapy    ED Prescriptions    Medication Sig Dispense Auth. Provider   cyclobenzaprine (FLEXERIL) 10 MG tablet Take 1 tablet (10 mg total) by mouth 3 (three) times daily. 30 tablet Rodriguez-Southworth, Nettie Elm, PA-C   ketorolac (TORADOL) 10 MG tablet Take 1 tablet (10 mg total) by mouth every 6 (six) hours as needed. 20 tablet Rodriguez-Southworth, Nettie Elm, PA-C     PDMP not reviewed this encounter.   Garey Ham, Cordelia Poche 05/31/20 2218

## 2020-06-01 DIAGNOSIS — M5386 Other specified dorsopathies, lumbar region: Secondary | ICD-10-CM | POA: Diagnosis not present

## 2020-06-01 DIAGNOSIS — M9904 Segmental and somatic dysfunction of sacral region: Secondary | ICD-10-CM | POA: Diagnosis not present

## 2020-06-01 DIAGNOSIS — M9903 Segmental and somatic dysfunction of lumbar region: Secondary | ICD-10-CM | POA: Diagnosis not present

## 2020-06-01 DIAGNOSIS — M9902 Segmental and somatic dysfunction of thoracic region: Secondary | ICD-10-CM | POA: Diagnosis not present

## 2020-06-04 DIAGNOSIS — M9903 Segmental and somatic dysfunction of lumbar region: Secondary | ICD-10-CM | POA: Diagnosis not present

## 2020-06-04 DIAGNOSIS — M9904 Segmental and somatic dysfunction of sacral region: Secondary | ICD-10-CM | POA: Diagnosis not present

## 2020-06-04 DIAGNOSIS — M9902 Segmental and somatic dysfunction of thoracic region: Secondary | ICD-10-CM | POA: Diagnosis not present

## 2020-06-04 DIAGNOSIS — M5386 Other specified dorsopathies, lumbar region: Secondary | ICD-10-CM | POA: Diagnosis not present

## 2020-06-05 DIAGNOSIS — J3081 Allergic rhinitis due to animal (cat) (dog) hair and dander: Secondary | ICD-10-CM | POA: Diagnosis not present

## 2020-06-05 DIAGNOSIS — J301 Allergic rhinitis due to pollen: Secondary | ICD-10-CM | POA: Diagnosis not present

## 2020-06-05 DIAGNOSIS — J3089 Other allergic rhinitis: Secondary | ICD-10-CM | POA: Diagnosis not present

## 2020-06-06 DIAGNOSIS — M5386 Other specified dorsopathies, lumbar region: Secondary | ICD-10-CM | POA: Diagnosis not present

## 2020-06-06 DIAGNOSIS — M9902 Segmental and somatic dysfunction of thoracic region: Secondary | ICD-10-CM | POA: Diagnosis not present

## 2020-06-06 DIAGNOSIS — M9903 Segmental and somatic dysfunction of lumbar region: Secondary | ICD-10-CM | POA: Diagnosis not present

## 2020-06-06 DIAGNOSIS — M9904 Segmental and somatic dysfunction of sacral region: Secondary | ICD-10-CM | POA: Diagnosis not present

## 2020-06-08 DIAGNOSIS — M9903 Segmental and somatic dysfunction of lumbar region: Secondary | ICD-10-CM | POA: Diagnosis not present

## 2020-06-08 DIAGNOSIS — M9902 Segmental and somatic dysfunction of thoracic region: Secondary | ICD-10-CM | POA: Diagnosis not present

## 2020-06-08 DIAGNOSIS — M5386 Other specified dorsopathies, lumbar region: Secondary | ICD-10-CM | POA: Diagnosis not present

## 2020-06-08 DIAGNOSIS — M9904 Segmental and somatic dysfunction of sacral region: Secondary | ICD-10-CM | POA: Diagnosis not present

## 2020-06-12 DIAGNOSIS — M9904 Segmental and somatic dysfunction of sacral region: Secondary | ICD-10-CM | POA: Diagnosis not present

## 2020-06-12 DIAGNOSIS — M5386 Other specified dorsopathies, lumbar region: Secondary | ICD-10-CM | POA: Diagnosis not present

## 2020-06-12 DIAGNOSIS — J3089 Other allergic rhinitis: Secondary | ICD-10-CM | POA: Diagnosis not present

## 2020-06-12 DIAGNOSIS — M9902 Segmental and somatic dysfunction of thoracic region: Secondary | ICD-10-CM | POA: Diagnosis not present

## 2020-06-12 DIAGNOSIS — J3081 Allergic rhinitis due to animal (cat) (dog) hair and dander: Secondary | ICD-10-CM | POA: Diagnosis not present

## 2020-06-12 DIAGNOSIS — J301 Allergic rhinitis due to pollen: Secondary | ICD-10-CM | POA: Diagnosis not present

## 2020-06-12 DIAGNOSIS — M9903 Segmental and somatic dysfunction of lumbar region: Secondary | ICD-10-CM | POA: Diagnosis not present

## 2020-06-18 DIAGNOSIS — J3089 Other allergic rhinitis: Secondary | ICD-10-CM | POA: Diagnosis not present

## 2020-06-18 DIAGNOSIS — J3081 Allergic rhinitis due to animal (cat) (dog) hair and dander: Secondary | ICD-10-CM | POA: Diagnosis not present

## 2020-06-18 DIAGNOSIS — M5386 Other specified dorsopathies, lumbar region: Secondary | ICD-10-CM | POA: Diagnosis not present

## 2020-06-18 DIAGNOSIS — M9903 Segmental and somatic dysfunction of lumbar region: Secondary | ICD-10-CM | POA: Diagnosis not present

## 2020-06-18 DIAGNOSIS — M9904 Segmental and somatic dysfunction of sacral region: Secondary | ICD-10-CM | POA: Diagnosis not present

## 2020-06-18 DIAGNOSIS — J301 Allergic rhinitis due to pollen: Secondary | ICD-10-CM | POA: Diagnosis not present

## 2020-06-18 DIAGNOSIS — M9902 Segmental and somatic dysfunction of thoracic region: Secondary | ICD-10-CM | POA: Diagnosis not present

## 2020-06-20 DIAGNOSIS — M9904 Segmental and somatic dysfunction of sacral region: Secondary | ICD-10-CM | POA: Diagnosis not present

## 2020-06-20 DIAGNOSIS — M9903 Segmental and somatic dysfunction of lumbar region: Secondary | ICD-10-CM | POA: Diagnosis not present

## 2020-06-20 DIAGNOSIS — M9902 Segmental and somatic dysfunction of thoracic region: Secondary | ICD-10-CM | POA: Diagnosis not present

## 2020-06-20 DIAGNOSIS — M5386 Other specified dorsopathies, lumbar region: Secondary | ICD-10-CM | POA: Diagnosis not present

## 2020-06-25 DIAGNOSIS — J3089 Other allergic rhinitis: Secondary | ICD-10-CM | POA: Diagnosis not present

## 2020-06-25 DIAGNOSIS — M9904 Segmental and somatic dysfunction of sacral region: Secondary | ICD-10-CM | POA: Diagnosis not present

## 2020-06-25 DIAGNOSIS — M9903 Segmental and somatic dysfunction of lumbar region: Secondary | ICD-10-CM | POA: Diagnosis not present

## 2020-06-25 DIAGNOSIS — M5386 Other specified dorsopathies, lumbar region: Secondary | ICD-10-CM | POA: Diagnosis not present

## 2020-06-25 DIAGNOSIS — J3081 Allergic rhinitis due to animal (cat) (dog) hair and dander: Secondary | ICD-10-CM | POA: Diagnosis not present

## 2020-06-25 DIAGNOSIS — M9902 Segmental and somatic dysfunction of thoracic region: Secondary | ICD-10-CM | POA: Diagnosis not present

## 2020-06-25 DIAGNOSIS — J301 Allergic rhinitis due to pollen: Secondary | ICD-10-CM | POA: Diagnosis not present

## 2020-06-27 DIAGNOSIS — M9904 Segmental and somatic dysfunction of sacral region: Secondary | ICD-10-CM | POA: Diagnosis not present

## 2020-06-27 DIAGNOSIS — M9903 Segmental and somatic dysfunction of lumbar region: Secondary | ICD-10-CM | POA: Diagnosis not present

## 2020-06-27 DIAGNOSIS — M5386 Other specified dorsopathies, lumbar region: Secondary | ICD-10-CM | POA: Diagnosis not present

## 2020-06-27 DIAGNOSIS — M9902 Segmental and somatic dysfunction of thoracic region: Secondary | ICD-10-CM | POA: Diagnosis not present

## 2020-07-02 DIAGNOSIS — M9903 Segmental and somatic dysfunction of lumbar region: Secondary | ICD-10-CM | POA: Diagnosis not present

## 2020-07-02 DIAGNOSIS — M9902 Segmental and somatic dysfunction of thoracic region: Secondary | ICD-10-CM | POA: Diagnosis not present

## 2020-07-02 DIAGNOSIS — M5386 Other specified dorsopathies, lumbar region: Secondary | ICD-10-CM | POA: Diagnosis not present

## 2020-07-02 DIAGNOSIS — M9904 Segmental and somatic dysfunction of sacral region: Secondary | ICD-10-CM | POA: Diagnosis not present

## 2020-07-03 DIAGNOSIS — J301 Allergic rhinitis due to pollen: Secondary | ICD-10-CM | POA: Diagnosis not present

## 2020-07-03 DIAGNOSIS — J3081 Allergic rhinitis due to animal (cat) (dog) hair and dander: Secondary | ICD-10-CM | POA: Diagnosis not present

## 2020-07-03 DIAGNOSIS — J3089 Other allergic rhinitis: Secondary | ICD-10-CM | POA: Diagnosis not present

## 2020-07-04 DIAGNOSIS — M5386 Other specified dorsopathies, lumbar region: Secondary | ICD-10-CM | POA: Diagnosis not present

## 2020-07-04 DIAGNOSIS — M9902 Segmental and somatic dysfunction of thoracic region: Secondary | ICD-10-CM | POA: Diagnosis not present

## 2020-07-04 DIAGNOSIS — M9904 Segmental and somatic dysfunction of sacral region: Secondary | ICD-10-CM | POA: Diagnosis not present

## 2020-07-04 DIAGNOSIS — M9903 Segmental and somatic dysfunction of lumbar region: Secondary | ICD-10-CM | POA: Diagnosis not present

## 2020-07-09 DIAGNOSIS — M5386 Other specified dorsopathies, lumbar region: Secondary | ICD-10-CM | POA: Diagnosis not present

## 2020-07-09 DIAGNOSIS — M9904 Segmental and somatic dysfunction of sacral region: Secondary | ICD-10-CM | POA: Diagnosis not present

## 2020-07-09 DIAGNOSIS — M9902 Segmental and somatic dysfunction of thoracic region: Secondary | ICD-10-CM | POA: Diagnosis not present

## 2020-07-09 DIAGNOSIS — M9903 Segmental and somatic dysfunction of lumbar region: Secondary | ICD-10-CM | POA: Diagnosis not present

## 2020-07-10 DIAGNOSIS — J3081 Allergic rhinitis due to animal (cat) (dog) hair and dander: Secondary | ICD-10-CM | POA: Diagnosis not present

## 2020-07-10 DIAGNOSIS — J301 Allergic rhinitis due to pollen: Secondary | ICD-10-CM | POA: Diagnosis not present

## 2020-07-10 DIAGNOSIS — J3089 Other allergic rhinitis: Secondary | ICD-10-CM | POA: Diagnosis not present

## 2020-07-17 DIAGNOSIS — J3081 Allergic rhinitis due to animal (cat) (dog) hair and dander: Secondary | ICD-10-CM | POA: Diagnosis not present

## 2020-07-17 DIAGNOSIS — J3089 Other allergic rhinitis: Secondary | ICD-10-CM | POA: Diagnosis not present

## 2020-07-17 DIAGNOSIS — J301 Allergic rhinitis due to pollen: Secondary | ICD-10-CM | POA: Diagnosis not present

## 2020-07-27 DIAGNOSIS — J301 Allergic rhinitis due to pollen: Secondary | ICD-10-CM | POA: Diagnosis not present

## 2020-07-27 DIAGNOSIS — J3089 Other allergic rhinitis: Secondary | ICD-10-CM | POA: Diagnosis not present

## 2020-07-27 DIAGNOSIS — J3081 Allergic rhinitis due to animal (cat) (dog) hair and dander: Secondary | ICD-10-CM | POA: Diagnosis not present

## 2020-07-31 DIAGNOSIS — J3089 Other allergic rhinitis: Secondary | ICD-10-CM | POA: Diagnosis not present

## 2020-07-31 DIAGNOSIS — J3081 Allergic rhinitis due to animal (cat) (dog) hair and dander: Secondary | ICD-10-CM | POA: Diagnosis not present

## 2020-07-31 DIAGNOSIS — J301 Allergic rhinitis due to pollen: Secondary | ICD-10-CM | POA: Diagnosis not present

## 2020-08-06 DIAGNOSIS — J3089 Other allergic rhinitis: Secondary | ICD-10-CM | POA: Diagnosis not present

## 2020-08-06 DIAGNOSIS — J301 Allergic rhinitis due to pollen: Secondary | ICD-10-CM | POA: Diagnosis not present

## 2020-08-06 DIAGNOSIS — J3081 Allergic rhinitis due to animal (cat) (dog) hair and dander: Secondary | ICD-10-CM | POA: Diagnosis not present

## 2020-08-07 DIAGNOSIS — J3089 Other allergic rhinitis: Secondary | ICD-10-CM | POA: Diagnosis not present

## 2020-08-07 DIAGNOSIS — J3081 Allergic rhinitis due to animal (cat) (dog) hair and dander: Secondary | ICD-10-CM | POA: Diagnosis not present

## 2020-08-14 DIAGNOSIS — J301 Allergic rhinitis due to pollen: Secondary | ICD-10-CM | POA: Diagnosis not present

## 2020-08-14 DIAGNOSIS — J3089 Other allergic rhinitis: Secondary | ICD-10-CM | POA: Diagnosis not present

## 2020-08-14 DIAGNOSIS — J3081 Allergic rhinitis due to animal (cat) (dog) hair and dander: Secondary | ICD-10-CM | POA: Diagnosis not present

## 2020-08-21 DIAGNOSIS — J3081 Allergic rhinitis due to animal (cat) (dog) hair and dander: Secondary | ICD-10-CM | POA: Diagnosis not present

## 2020-08-21 DIAGNOSIS — J3089 Other allergic rhinitis: Secondary | ICD-10-CM | POA: Diagnosis not present

## 2020-08-21 DIAGNOSIS — J301 Allergic rhinitis due to pollen: Secondary | ICD-10-CM | POA: Diagnosis not present

## 2020-08-28 DIAGNOSIS — J3081 Allergic rhinitis due to animal (cat) (dog) hair and dander: Secondary | ICD-10-CM | POA: Diagnosis not present

## 2020-08-28 DIAGNOSIS — J301 Allergic rhinitis due to pollen: Secondary | ICD-10-CM | POA: Diagnosis not present

## 2020-08-28 DIAGNOSIS — J3089 Other allergic rhinitis: Secondary | ICD-10-CM | POA: Diagnosis not present

## 2020-08-31 DIAGNOSIS — Z131 Encounter for screening for diabetes mellitus: Secondary | ICD-10-CM | POA: Diagnosis not present

## 2020-08-31 DIAGNOSIS — Z13220 Encounter for screening for lipoid disorders: Secondary | ICD-10-CM | POA: Diagnosis not present

## 2020-08-31 DIAGNOSIS — Z0000 Encounter for general adult medical examination without abnormal findings: Secondary | ICD-10-CM | POA: Diagnosis not present

## 2020-08-31 DIAGNOSIS — Z23 Encounter for immunization: Secondary | ICD-10-CM | POA: Diagnosis not present

## 2020-09-03 DIAGNOSIS — J3081 Allergic rhinitis due to animal (cat) (dog) hair and dander: Secondary | ICD-10-CM | POA: Diagnosis not present

## 2020-09-03 DIAGNOSIS — J3089 Other allergic rhinitis: Secondary | ICD-10-CM | POA: Diagnosis not present

## 2020-09-03 DIAGNOSIS — J301 Allergic rhinitis due to pollen: Secondary | ICD-10-CM | POA: Diagnosis not present

## 2020-09-11 DIAGNOSIS — J3089 Other allergic rhinitis: Secondary | ICD-10-CM | POA: Diagnosis not present

## 2020-09-11 DIAGNOSIS — J301 Allergic rhinitis due to pollen: Secondary | ICD-10-CM | POA: Diagnosis not present

## 2020-09-11 DIAGNOSIS — J3081 Allergic rhinitis due to animal (cat) (dog) hair and dander: Secondary | ICD-10-CM | POA: Diagnosis not present

## 2020-09-18 DIAGNOSIS — J301 Allergic rhinitis due to pollen: Secondary | ICD-10-CM | POA: Diagnosis not present

## 2020-09-18 DIAGNOSIS — J3081 Allergic rhinitis due to animal (cat) (dog) hair and dander: Secondary | ICD-10-CM | POA: Diagnosis not present

## 2020-09-18 DIAGNOSIS — J3089 Other allergic rhinitis: Secondary | ICD-10-CM | POA: Diagnosis not present

## 2020-09-25 DIAGNOSIS — J3081 Allergic rhinitis due to animal (cat) (dog) hair and dander: Secondary | ICD-10-CM | POA: Diagnosis not present

## 2020-09-25 DIAGNOSIS — J3089 Other allergic rhinitis: Secondary | ICD-10-CM | POA: Diagnosis not present

## 2020-09-25 DIAGNOSIS — J301 Allergic rhinitis due to pollen: Secondary | ICD-10-CM | POA: Diagnosis not present

## 2020-10-02 DIAGNOSIS — J3089 Other allergic rhinitis: Secondary | ICD-10-CM | POA: Diagnosis not present

## 2020-10-02 DIAGNOSIS — J301 Allergic rhinitis due to pollen: Secondary | ICD-10-CM | POA: Diagnosis not present

## 2020-10-02 DIAGNOSIS — J3081 Allergic rhinitis due to animal (cat) (dog) hair and dander: Secondary | ICD-10-CM | POA: Diagnosis not present

## 2020-10-09 DIAGNOSIS — J3089 Other allergic rhinitis: Secondary | ICD-10-CM | POA: Diagnosis not present

## 2020-10-09 DIAGNOSIS — J301 Allergic rhinitis due to pollen: Secondary | ICD-10-CM | POA: Diagnosis not present

## 2020-10-09 DIAGNOSIS — J3081 Allergic rhinitis due to animal (cat) (dog) hair and dander: Secondary | ICD-10-CM | POA: Diagnosis not present

## 2020-10-15 DIAGNOSIS — J3081 Allergic rhinitis due to animal (cat) (dog) hair and dander: Secondary | ICD-10-CM | POA: Diagnosis not present

## 2020-10-15 DIAGNOSIS — J301 Allergic rhinitis due to pollen: Secondary | ICD-10-CM | POA: Diagnosis not present

## 2020-10-15 DIAGNOSIS — J3089 Other allergic rhinitis: Secondary | ICD-10-CM | POA: Diagnosis not present

## 2020-10-23 DIAGNOSIS — J3081 Allergic rhinitis due to animal (cat) (dog) hair and dander: Secondary | ICD-10-CM | POA: Diagnosis not present

## 2020-10-23 DIAGNOSIS — J3089 Other allergic rhinitis: Secondary | ICD-10-CM | POA: Diagnosis not present

## 2020-10-23 DIAGNOSIS — J301 Allergic rhinitis due to pollen: Secondary | ICD-10-CM | POA: Diagnosis not present

## 2020-10-30 DIAGNOSIS — J3089 Other allergic rhinitis: Secondary | ICD-10-CM | POA: Diagnosis not present

## 2020-10-30 DIAGNOSIS — J301 Allergic rhinitis due to pollen: Secondary | ICD-10-CM | POA: Diagnosis not present

## 2020-10-30 DIAGNOSIS — J3081 Allergic rhinitis due to animal (cat) (dog) hair and dander: Secondary | ICD-10-CM | POA: Diagnosis not present

## 2020-11-05 DIAGNOSIS — J301 Allergic rhinitis due to pollen: Secondary | ICD-10-CM | POA: Diagnosis not present

## 2020-11-05 DIAGNOSIS — J3081 Allergic rhinitis due to animal (cat) (dog) hair and dander: Secondary | ICD-10-CM | POA: Diagnosis not present

## 2020-11-05 DIAGNOSIS — J3089 Other allergic rhinitis: Secondary | ICD-10-CM | POA: Diagnosis not present

## 2020-11-12 DIAGNOSIS — J3089 Other allergic rhinitis: Secondary | ICD-10-CM | POA: Diagnosis not present

## 2020-11-12 DIAGNOSIS — J3081 Allergic rhinitis due to animal (cat) (dog) hair and dander: Secondary | ICD-10-CM | POA: Diagnosis not present

## 2020-11-12 DIAGNOSIS — J301 Allergic rhinitis due to pollen: Secondary | ICD-10-CM | POA: Diagnosis not present

## 2020-11-20 DIAGNOSIS — J301 Allergic rhinitis due to pollen: Secondary | ICD-10-CM | POA: Diagnosis not present

## 2020-11-20 DIAGNOSIS — J3089 Other allergic rhinitis: Secondary | ICD-10-CM | POA: Diagnosis not present

## 2020-11-20 DIAGNOSIS — J3081 Allergic rhinitis due to animal (cat) (dog) hair and dander: Secondary | ICD-10-CM | POA: Diagnosis not present

## 2020-11-27 DIAGNOSIS — J3081 Allergic rhinitis due to animal (cat) (dog) hair and dander: Secondary | ICD-10-CM | POA: Diagnosis not present

## 2020-11-27 DIAGNOSIS — J301 Allergic rhinitis due to pollen: Secondary | ICD-10-CM | POA: Diagnosis not present

## 2020-11-27 DIAGNOSIS — J3089 Other allergic rhinitis: Secondary | ICD-10-CM | POA: Diagnosis not present

## 2020-12-04 DIAGNOSIS — J3081 Allergic rhinitis due to animal (cat) (dog) hair and dander: Secondary | ICD-10-CM | POA: Diagnosis not present

## 2020-12-04 DIAGNOSIS — J3089 Other allergic rhinitis: Secondary | ICD-10-CM | POA: Diagnosis not present

## 2020-12-04 DIAGNOSIS — J301 Allergic rhinitis due to pollen: Secondary | ICD-10-CM | POA: Diagnosis not present

## 2020-12-10 DIAGNOSIS — J3089 Other allergic rhinitis: Secondary | ICD-10-CM | POA: Diagnosis not present

## 2020-12-10 DIAGNOSIS — J3081 Allergic rhinitis due to animal (cat) (dog) hair and dander: Secondary | ICD-10-CM | POA: Diagnosis not present

## 2020-12-10 DIAGNOSIS — J301 Allergic rhinitis due to pollen: Secondary | ICD-10-CM | POA: Diagnosis not present

## 2020-12-18 DIAGNOSIS — J3081 Allergic rhinitis due to animal (cat) (dog) hair and dander: Secondary | ICD-10-CM | POA: Diagnosis not present

## 2020-12-18 DIAGNOSIS — J301 Allergic rhinitis due to pollen: Secondary | ICD-10-CM | POA: Diagnosis not present

## 2020-12-18 DIAGNOSIS — J3089 Other allergic rhinitis: Secondary | ICD-10-CM | POA: Diagnosis not present

## 2020-12-24 DIAGNOSIS — J3089 Other allergic rhinitis: Secondary | ICD-10-CM | POA: Diagnosis not present

## 2020-12-24 DIAGNOSIS — J3081 Allergic rhinitis due to animal (cat) (dog) hair and dander: Secondary | ICD-10-CM | POA: Diagnosis not present

## 2020-12-24 DIAGNOSIS — J301 Allergic rhinitis due to pollen: Secondary | ICD-10-CM | POA: Diagnosis not present

## 2020-12-31 DIAGNOSIS — J3089 Other allergic rhinitis: Secondary | ICD-10-CM | POA: Diagnosis not present

## 2020-12-31 DIAGNOSIS — J301 Allergic rhinitis due to pollen: Secondary | ICD-10-CM | POA: Diagnosis not present

## 2020-12-31 DIAGNOSIS — J3081 Allergic rhinitis due to animal (cat) (dog) hair and dander: Secondary | ICD-10-CM | POA: Diagnosis not present

## 2021-01-08 DIAGNOSIS — J3089 Other allergic rhinitis: Secondary | ICD-10-CM | POA: Diagnosis not present

## 2021-01-08 DIAGNOSIS — J3081 Allergic rhinitis due to animal (cat) (dog) hair and dander: Secondary | ICD-10-CM | POA: Diagnosis not present

## 2021-01-08 DIAGNOSIS — J301 Allergic rhinitis due to pollen: Secondary | ICD-10-CM | POA: Diagnosis not present

## 2021-01-15 DIAGNOSIS — J301 Allergic rhinitis due to pollen: Secondary | ICD-10-CM | POA: Diagnosis not present

## 2021-01-15 DIAGNOSIS — J3081 Allergic rhinitis due to animal (cat) (dog) hair and dander: Secondary | ICD-10-CM | POA: Diagnosis not present

## 2021-01-15 DIAGNOSIS — J3089 Other allergic rhinitis: Secondary | ICD-10-CM | POA: Diagnosis not present

## 2021-01-22 DIAGNOSIS — J3089 Other allergic rhinitis: Secondary | ICD-10-CM | POA: Diagnosis not present

## 2021-01-22 DIAGNOSIS — J3081 Allergic rhinitis due to animal (cat) (dog) hair and dander: Secondary | ICD-10-CM | POA: Diagnosis not present

## 2021-01-22 DIAGNOSIS — J301 Allergic rhinitis due to pollen: Secondary | ICD-10-CM | POA: Diagnosis not present

## 2021-01-29 DIAGNOSIS — J3081 Allergic rhinitis due to animal (cat) (dog) hair and dander: Secondary | ICD-10-CM | POA: Diagnosis not present

## 2021-01-29 DIAGNOSIS — J301 Allergic rhinitis due to pollen: Secondary | ICD-10-CM | POA: Diagnosis not present

## 2021-01-29 DIAGNOSIS — J3089 Other allergic rhinitis: Secondary | ICD-10-CM | POA: Diagnosis not present

## 2021-02-05 DIAGNOSIS — J3081 Allergic rhinitis due to animal (cat) (dog) hair and dander: Secondary | ICD-10-CM | POA: Diagnosis not present

## 2021-02-05 DIAGNOSIS — J301 Allergic rhinitis due to pollen: Secondary | ICD-10-CM | POA: Diagnosis not present

## 2021-02-05 DIAGNOSIS — J3089 Other allergic rhinitis: Secondary | ICD-10-CM | POA: Diagnosis not present

## 2021-02-06 DIAGNOSIS — J301 Allergic rhinitis due to pollen: Secondary | ICD-10-CM | POA: Diagnosis not present

## 2021-02-07 DIAGNOSIS — J301 Allergic rhinitis due to pollen: Secondary | ICD-10-CM | POA: Diagnosis not present

## 2021-02-07 DIAGNOSIS — J3081 Allergic rhinitis due to animal (cat) (dog) hair and dander: Secondary | ICD-10-CM | POA: Diagnosis not present

## 2021-02-12 DIAGNOSIS — J3089 Other allergic rhinitis: Secondary | ICD-10-CM | POA: Diagnosis not present

## 2021-02-12 DIAGNOSIS — J301 Allergic rhinitis due to pollen: Secondary | ICD-10-CM | POA: Diagnosis not present

## 2021-02-12 DIAGNOSIS — J3081 Allergic rhinitis due to animal (cat) (dog) hair and dander: Secondary | ICD-10-CM | POA: Diagnosis not present

## 2021-02-19 DIAGNOSIS — J301 Allergic rhinitis due to pollen: Secondary | ICD-10-CM | POA: Diagnosis not present

## 2021-02-19 DIAGNOSIS — J3081 Allergic rhinitis due to animal (cat) (dog) hair and dander: Secondary | ICD-10-CM | POA: Diagnosis not present

## 2021-02-19 DIAGNOSIS — J3089 Other allergic rhinitis: Secondary | ICD-10-CM | POA: Diagnosis not present

## 2021-02-26 DIAGNOSIS — J301 Allergic rhinitis due to pollen: Secondary | ICD-10-CM | POA: Diagnosis not present

## 2021-02-26 DIAGNOSIS — J3081 Allergic rhinitis due to animal (cat) (dog) hair and dander: Secondary | ICD-10-CM | POA: Diagnosis not present

## 2021-02-26 DIAGNOSIS — J3089 Other allergic rhinitis: Secondary | ICD-10-CM | POA: Diagnosis not present

## 2021-03-05 DIAGNOSIS — J3089 Other allergic rhinitis: Secondary | ICD-10-CM | POA: Diagnosis not present

## 2021-03-05 DIAGNOSIS — J3081 Allergic rhinitis due to animal (cat) (dog) hair and dander: Secondary | ICD-10-CM | POA: Diagnosis not present

## 2021-03-05 DIAGNOSIS — J301 Allergic rhinitis due to pollen: Secondary | ICD-10-CM | POA: Diagnosis not present

## 2021-03-12 DIAGNOSIS — J301 Allergic rhinitis due to pollen: Secondary | ICD-10-CM | POA: Diagnosis not present

## 2021-03-12 DIAGNOSIS — J3089 Other allergic rhinitis: Secondary | ICD-10-CM | POA: Diagnosis not present

## 2021-03-12 DIAGNOSIS — J3081 Allergic rhinitis due to animal (cat) (dog) hair and dander: Secondary | ICD-10-CM | POA: Diagnosis not present

## 2021-03-19 DIAGNOSIS — J3089 Other allergic rhinitis: Secondary | ICD-10-CM | POA: Diagnosis not present

## 2021-03-19 DIAGNOSIS — J3081 Allergic rhinitis due to animal (cat) (dog) hair and dander: Secondary | ICD-10-CM | POA: Diagnosis not present

## 2021-03-19 DIAGNOSIS — J301 Allergic rhinitis due to pollen: Secondary | ICD-10-CM | POA: Diagnosis not present

## 2021-03-26 DIAGNOSIS — J3081 Allergic rhinitis due to animal (cat) (dog) hair and dander: Secondary | ICD-10-CM | POA: Diagnosis not present

## 2021-03-26 DIAGNOSIS — J301 Allergic rhinitis due to pollen: Secondary | ICD-10-CM | POA: Diagnosis not present

## 2021-03-26 DIAGNOSIS — J3089 Other allergic rhinitis: Secondary | ICD-10-CM | POA: Diagnosis not present

## 2021-04-04 DIAGNOSIS — J3089 Other allergic rhinitis: Secondary | ICD-10-CM | POA: Diagnosis not present

## 2021-04-04 DIAGNOSIS — J3081 Allergic rhinitis due to animal (cat) (dog) hair and dander: Secondary | ICD-10-CM | POA: Diagnosis not present

## 2021-04-04 DIAGNOSIS — J301 Allergic rhinitis due to pollen: Secondary | ICD-10-CM | POA: Diagnosis not present

## 2021-04-09 DIAGNOSIS — J3089 Other allergic rhinitis: Secondary | ICD-10-CM | POA: Diagnosis not present

## 2021-04-09 DIAGNOSIS — J3081 Allergic rhinitis due to animal (cat) (dog) hair and dander: Secondary | ICD-10-CM | POA: Diagnosis not present

## 2021-04-09 DIAGNOSIS — J301 Allergic rhinitis due to pollen: Secondary | ICD-10-CM | POA: Diagnosis not present

## 2021-04-17 DIAGNOSIS — J301 Allergic rhinitis due to pollen: Secondary | ICD-10-CM | POA: Diagnosis not present

## 2021-04-17 DIAGNOSIS — J3081 Allergic rhinitis due to animal (cat) (dog) hair and dander: Secondary | ICD-10-CM | POA: Diagnosis not present

## 2021-04-17 DIAGNOSIS — J3089 Other allergic rhinitis: Secondary | ICD-10-CM | POA: Diagnosis not present

## 2021-04-23 DIAGNOSIS — J3081 Allergic rhinitis due to animal (cat) (dog) hair and dander: Secondary | ICD-10-CM | POA: Diagnosis not present

## 2021-04-23 DIAGNOSIS — J3089 Other allergic rhinitis: Secondary | ICD-10-CM | POA: Diagnosis not present

## 2021-04-23 DIAGNOSIS — J301 Allergic rhinitis due to pollen: Secondary | ICD-10-CM | POA: Diagnosis not present

## 2021-04-30 DIAGNOSIS — J3081 Allergic rhinitis due to animal (cat) (dog) hair and dander: Secondary | ICD-10-CM | POA: Diagnosis not present

## 2021-04-30 DIAGNOSIS — J301 Allergic rhinitis due to pollen: Secondary | ICD-10-CM | POA: Diagnosis not present

## 2021-04-30 DIAGNOSIS — J3089 Other allergic rhinitis: Secondary | ICD-10-CM | POA: Diagnosis not present

## 2021-05-07 DIAGNOSIS — J3081 Allergic rhinitis due to animal (cat) (dog) hair and dander: Secondary | ICD-10-CM | POA: Diagnosis not present

## 2021-05-07 DIAGNOSIS — J301 Allergic rhinitis due to pollen: Secondary | ICD-10-CM | POA: Diagnosis not present

## 2021-05-07 DIAGNOSIS — J3089 Other allergic rhinitis: Secondary | ICD-10-CM | POA: Diagnosis not present

## 2021-05-15 DIAGNOSIS — J301 Allergic rhinitis due to pollen: Secondary | ICD-10-CM | POA: Diagnosis not present

## 2021-05-15 DIAGNOSIS — J3081 Allergic rhinitis due to animal (cat) (dog) hair and dander: Secondary | ICD-10-CM | POA: Diagnosis not present

## 2021-05-15 DIAGNOSIS — J3089 Other allergic rhinitis: Secondary | ICD-10-CM | POA: Diagnosis not present

## 2021-05-21 DIAGNOSIS — J301 Allergic rhinitis due to pollen: Secondary | ICD-10-CM | POA: Diagnosis not present

## 2021-05-21 DIAGNOSIS — J3089 Other allergic rhinitis: Secondary | ICD-10-CM | POA: Diagnosis not present

## 2021-05-21 DIAGNOSIS — J3081 Allergic rhinitis due to animal (cat) (dog) hair and dander: Secondary | ICD-10-CM | POA: Diagnosis not present

## 2021-05-28 DIAGNOSIS — J3089 Other allergic rhinitis: Secondary | ICD-10-CM | POA: Diagnosis not present

## 2021-05-28 DIAGNOSIS — J3081 Allergic rhinitis due to animal (cat) (dog) hair and dander: Secondary | ICD-10-CM | POA: Diagnosis not present

## 2021-05-28 DIAGNOSIS — J301 Allergic rhinitis due to pollen: Secondary | ICD-10-CM | POA: Diagnosis not present

## 2021-06-05 DIAGNOSIS — J3081 Allergic rhinitis due to animal (cat) (dog) hair and dander: Secondary | ICD-10-CM | POA: Diagnosis not present

## 2021-06-05 DIAGNOSIS — J3089 Other allergic rhinitis: Secondary | ICD-10-CM | POA: Diagnosis not present

## 2021-06-05 DIAGNOSIS — J301 Allergic rhinitis due to pollen: Secondary | ICD-10-CM | POA: Diagnosis not present

## 2021-06-11 DIAGNOSIS — J301 Allergic rhinitis due to pollen: Secondary | ICD-10-CM | POA: Diagnosis not present

## 2021-06-11 DIAGNOSIS — J3089 Other allergic rhinitis: Secondary | ICD-10-CM | POA: Diagnosis not present

## 2021-06-11 DIAGNOSIS — J3081 Allergic rhinitis due to animal (cat) (dog) hair and dander: Secondary | ICD-10-CM | POA: Diagnosis not present

## 2021-06-18 DIAGNOSIS — J3081 Allergic rhinitis due to animal (cat) (dog) hair and dander: Secondary | ICD-10-CM | POA: Diagnosis not present

## 2021-06-18 DIAGNOSIS — J3089 Other allergic rhinitis: Secondary | ICD-10-CM | POA: Diagnosis not present

## 2021-06-18 DIAGNOSIS — J301 Allergic rhinitis due to pollen: Secondary | ICD-10-CM | POA: Diagnosis not present

## 2021-06-25 DIAGNOSIS — J301 Allergic rhinitis due to pollen: Secondary | ICD-10-CM | POA: Diagnosis not present

## 2021-06-25 DIAGNOSIS — J3089 Other allergic rhinitis: Secondary | ICD-10-CM | POA: Diagnosis not present

## 2021-06-25 DIAGNOSIS — J3081 Allergic rhinitis due to animal (cat) (dog) hair and dander: Secondary | ICD-10-CM | POA: Diagnosis not present

## 2021-07-01 DIAGNOSIS — J3081 Allergic rhinitis due to animal (cat) (dog) hair and dander: Secondary | ICD-10-CM | POA: Diagnosis not present

## 2021-07-01 DIAGNOSIS — J3089 Other allergic rhinitis: Secondary | ICD-10-CM | POA: Diagnosis not present

## 2021-07-01 DIAGNOSIS — J301 Allergic rhinitis due to pollen: Secondary | ICD-10-CM | POA: Diagnosis not present

## 2021-07-10 DIAGNOSIS — J301 Allergic rhinitis due to pollen: Secondary | ICD-10-CM | POA: Diagnosis not present

## 2021-07-10 DIAGNOSIS — J3081 Allergic rhinitis due to animal (cat) (dog) hair and dander: Secondary | ICD-10-CM | POA: Diagnosis not present

## 2021-07-10 DIAGNOSIS — J3089 Other allergic rhinitis: Secondary | ICD-10-CM | POA: Diagnosis not present

## 2021-07-17 DIAGNOSIS — J301 Allergic rhinitis due to pollen: Secondary | ICD-10-CM | POA: Diagnosis not present

## 2021-07-17 DIAGNOSIS — J3089 Other allergic rhinitis: Secondary | ICD-10-CM | POA: Diagnosis not present

## 2021-07-17 DIAGNOSIS — J3081 Allergic rhinitis due to animal (cat) (dog) hair and dander: Secondary | ICD-10-CM | POA: Diagnosis not present

## 2021-07-22 DIAGNOSIS — J301 Allergic rhinitis due to pollen: Secondary | ICD-10-CM | POA: Diagnosis not present

## 2021-07-22 DIAGNOSIS — J3089 Other allergic rhinitis: Secondary | ICD-10-CM | POA: Diagnosis not present

## 2021-07-22 DIAGNOSIS — J3081 Allergic rhinitis due to animal (cat) (dog) hair and dander: Secondary | ICD-10-CM | POA: Diagnosis not present

## 2021-07-24 DIAGNOSIS — J3089 Other allergic rhinitis: Secondary | ICD-10-CM | POA: Diagnosis not present

## 2021-07-24 DIAGNOSIS — J301 Allergic rhinitis due to pollen: Secondary | ICD-10-CM | POA: Diagnosis not present

## 2021-07-24 DIAGNOSIS — J3081 Allergic rhinitis due to animal (cat) (dog) hair and dander: Secondary | ICD-10-CM | POA: Diagnosis not present

## 2021-07-31 DIAGNOSIS — J301 Allergic rhinitis due to pollen: Secondary | ICD-10-CM | POA: Diagnosis not present

## 2021-07-31 DIAGNOSIS — J3081 Allergic rhinitis due to animal (cat) (dog) hair and dander: Secondary | ICD-10-CM | POA: Diagnosis not present

## 2021-07-31 DIAGNOSIS — J3089 Other allergic rhinitis: Secondary | ICD-10-CM | POA: Diagnosis not present

## 2021-08-07 DIAGNOSIS — J3081 Allergic rhinitis due to animal (cat) (dog) hair and dander: Secondary | ICD-10-CM | POA: Diagnosis not present

## 2021-08-07 DIAGNOSIS — J301 Allergic rhinitis due to pollen: Secondary | ICD-10-CM | POA: Diagnosis not present

## 2021-08-07 DIAGNOSIS — J3089 Other allergic rhinitis: Secondary | ICD-10-CM | POA: Diagnosis not present

## 2021-08-14 DIAGNOSIS — J3089 Other allergic rhinitis: Secondary | ICD-10-CM | POA: Diagnosis not present

## 2021-08-14 DIAGNOSIS — J301 Allergic rhinitis due to pollen: Secondary | ICD-10-CM | POA: Diagnosis not present

## 2021-08-14 DIAGNOSIS — J3081 Allergic rhinitis due to animal (cat) (dog) hair and dander: Secondary | ICD-10-CM | POA: Diagnosis not present

## 2021-08-21 DIAGNOSIS — J301 Allergic rhinitis due to pollen: Secondary | ICD-10-CM | POA: Diagnosis not present

## 2021-08-21 DIAGNOSIS — J3081 Allergic rhinitis due to animal (cat) (dog) hair and dander: Secondary | ICD-10-CM | POA: Diagnosis not present

## 2021-08-21 DIAGNOSIS — J3089 Other allergic rhinitis: Secondary | ICD-10-CM | POA: Diagnosis not present

## 2021-08-28 DIAGNOSIS — J3089 Other allergic rhinitis: Secondary | ICD-10-CM | POA: Diagnosis not present

## 2021-08-28 DIAGNOSIS — J3081 Allergic rhinitis due to animal (cat) (dog) hair and dander: Secondary | ICD-10-CM | POA: Diagnosis not present

## 2021-08-28 DIAGNOSIS — J301 Allergic rhinitis due to pollen: Secondary | ICD-10-CM | POA: Diagnosis not present

## 2021-09-04 DIAGNOSIS — J3089 Other allergic rhinitis: Secondary | ICD-10-CM | POA: Diagnosis not present

## 2021-09-04 DIAGNOSIS — J3081 Allergic rhinitis due to animal (cat) (dog) hair and dander: Secondary | ICD-10-CM | POA: Diagnosis not present

## 2021-09-04 DIAGNOSIS — J301 Allergic rhinitis due to pollen: Secondary | ICD-10-CM | POA: Diagnosis not present

## 2021-09-11 DIAGNOSIS — J301 Allergic rhinitis due to pollen: Secondary | ICD-10-CM | POA: Diagnosis not present

## 2021-09-11 DIAGNOSIS — J3081 Allergic rhinitis due to animal (cat) (dog) hair and dander: Secondary | ICD-10-CM | POA: Diagnosis not present

## 2021-09-11 DIAGNOSIS — J3089 Other allergic rhinitis: Secondary | ICD-10-CM | POA: Diagnosis not present

## 2021-09-17 DIAGNOSIS — E785 Hyperlipidemia, unspecified: Secondary | ICD-10-CM | POA: Diagnosis not present

## 2021-09-17 DIAGNOSIS — Z125 Encounter for screening for malignant neoplasm of prostate: Secondary | ICD-10-CM | POA: Diagnosis not present

## 2021-09-17 DIAGNOSIS — Z1159 Encounter for screening for other viral diseases: Secondary | ICD-10-CM | POA: Diagnosis not present

## 2021-09-17 DIAGNOSIS — Z0000 Encounter for general adult medical examination without abnormal findings: Secondary | ICD-10-CM | POA: Diagnosis not present

## 2021-09-17 DIAGNOSIS — E669 Obesity, unspecified: Secondary | ICD-10-CM | POA: Diagnosis not present

## 2021-09-18 DIAGNOSIS — J301 Allergic rhinitis due to pollen: Secondary | ICD-10-CM | POA: Diagnosis not present

## 2021-09-18 DIAGNOSIS — J3089 Other allergic rhinitis: Secondary | ICD-10-CM | POA: Diagnosis not present

## 2021-09-18 DIAGNOSIS — J3081 Allergic rhinitis due to animal (cat) (dog) hair and dander: Secondary | ICD-10-CM | POA: Diagnosis not present

## 2021-09-25 DIAGNOSIS — J301 Allergic rhinitis due to pollen: Secondary | ICD-10-CM | POA: Diagnosis not present

## 2021-09-25 DIAGNOSIS — J3081 Allergic rhinitis due to animal (cat) (dog) hair and dander: Secondary | ICD-10-CM | POA: Diagnosis not present

## 2021-09-25 DIAGNOSIS — J3089 Other allergic rhinitis: Secondary | ICD-10-CM | POA: Diagnosis not present

## 2021-10-02 DIAGNOSIS — J3089 Other allergic rhinitis: Secondary | ICD-10-CM | POA: Diagnosis not present

## 2021-10-02 DIAGNOSIS — J301 Allergic rhinitis due to pollen: Secondary | ICD-10-CM | POA: Diagnosis not present

## 2021-10-02 DIAGNOSIS — J3081 Allergic rhinitis due to animal (cat) (dog) hair and dander: Secondary | ICD-10-CM | POA: Diagnosis not present

## 2021-10-16 DIAGNOSIS — J301 Allergic rhinitis due to pollen: Secondary | ICD-10-CM | POA: Diagnosis not present

## 2021-10-16 DIAGNOSIS — J3089 Other allergic rhinitis: Secondary | ICD-10-CM | POA: Diagnosis not present

## 2021-10-16 DIAGNOSIS — J3081 Allergic rhinitis due to animal (cat) (dog) hair and dander: Secondary | ICD-10-CM | POA: Diagnosis not present

## 2021-10-18 DIAGNOSIS — J3081 Allergic rhinitis due to animal (cat) (dog) hair and dander: Secondary | ICD-10-CM | POA: Diagnosis not present

## 2021-10-18 DIAGNOSIS — J3089 Other allergic rhinitis: Secondary | ICD-10-CM | POA: Diagnosis not present

## 2021-10-21 DIAGNOSIS — J301 Allergic rhinitis due to pollen: Secondary | ICD-10-CM | POA: Diagnosis not present

## 2021-10-24 DIAGNOSIS — J3089 Other allergic rhinitis: Secondary | ICD-10-CM | POA: Diagnosis not present

## 2021-10-24 DIAGNOSIS — J301 Allergic rhinitis due to pollen: Secondary | ICD-10-CM | POA: Diagnosis not present

## 2021-10-24 DIAGNOSIS — J3081 Allergic rhinitis due to animal (cat) (dog) hair and dander: Secondary | ICD-10-CM | POA: Diagnosis not present

## 2021-10-30 DIAGNOSIS — J301 Allergic rhinitis due to pollen: Secondary | ICD-10-CM | POA: Diagnosis not present

## 2021-10-30 DIAGNOSIS — J3081 Allergic rhinitis due to animal (cat) (dog) hair and dander: Secondary | ICD-10-CM | POA: Diagnosis not present

## 2021-10-30 DIAGNOSIS — J3089 Other allergic rhinitis: Secondary | ICD-10-CM | POA: Diagnosis not present

## 2021-11-06 DIAGNOSIS — J3089 Other allergic rhinitis: Secondary | ICD-10-CM | POA: Diagnosis not present

## 2021-11-06 DIAGNOSIS — J3081 Allergic rhinitis due to animal (cat) (dog) hair and dander: Secondary | ICD-10-CM | POA: Diagnosis not present

## 2021-11-06 DIAGNOSIS — J301 Allergic rhinitis due to pollen: Secondary | ICD-10-CM | POA: Diagnosis not present

## 2021-11-14 DIAGNOSIS — J301 Allergic rhinitis due to pollen: Secondary | ICD-10-CM | POA: Diagnosis not present

## 2021-11-14 DIAGNOSIS — J3089 Other allergic rhinitis: Secondary | ICD-10-CM | POA: Diagnosis not present

## 2021-11-14 DIAGNOSIS — J3081 Allergic rhinitis due to animal (cat) (dog) hair and dander: Secondary | ICD-10-CM | POA: Diagnosis not present

## 2021-11-20 DIAGNOSIS — J3089 Other allergic rhinitis: Secondary | ICD-10-CM | POA: Diagnosis not present

## 2021-11-20 DIAGNOSIS — J3081 Allergic rhinitis due to animal (cat) (dog) hair and dander: Secondary | ICD-10-CM | POA: Diagnosis not present

## 2021-11-20 DIAGNOSIS — J301 Allergic rhinitis due to pollen: Secondary | ICD-10-CM | POA: Diagnosis not present

## 2021-11-25 DIAGNOSIS — J301 Allergic rhinitis due to pollen: Secondary | ICD-10-CM | POA: Diagnosis not present

## 2021-11-25 DIAGNOSIS — J3081 Allergic rhinitis due to animal (cat) (dog) hair and dander: Secondary | ICD-10-CM | POA: Diagnosis not present

## 2021-11-25 DIAGNOSIS — J3089 Other allergic rhinitis: Secondary | ICD-10-CM | POA: Diagnosis not present

## 2021-12-04 DIAGNOSIS — J3081 Allergic rhinitis due to animal (cat) (dog) hair and dander: Secondary | ICD-10-CM | POA: Diagnosis not present

## 2021-12-04 DIAGNOSIS — J301 Allergic rhinitis due to pollen: Secondary | ICD-10-CM | POA: Diagnosis not present

## 2021-12-04 DIAGNOSIS — J3089 Other allergic rhinitis: Secondary | ICD-10-CM | POA: Diagnosis not present

## 2021-12-11 DIAGNOSIS — J3089 Other allergic rhinitis: Secondary | ICD-10-CM | POA: Diagnosis not present

## 2021-12-11 DIAGNOSIS — J3081 Allergic rhinitis due to animal (cat) (dog) hair and dander: Secondary | ICD-10-CM | POA: Diagnosis not present

## 2021-12-11 DIAGNOSIS — J301 Allergic rhinitis due to pollen: Secondary | ICD-10-CM | POA: Diagnosis not present

## 2021-12-18 DIAGNOSIS — J301 Allergic rhinitis due to pollen: Secondary | ICD-10-CM | POA: Diagnosis not present

## 2021-12-18 DIAGNOSIS — J3089 Other allergic rhinitis: Secondary | ICD-10-CM | POA: Diagnosis not present

## 2021-12-18 DIAGNOSIS — J3081 Allergic rhinitis due to animal (cat) (dog) hair and dander: Secondary | ICD-10-CM | POA: Diagnosis not present

## 2021-12-24 DIAGNOSIS — J3089 Other allergic rhinitis: Secondary | ICD-10-CM | POA: Diagnosis not present

## 2021-12-24 DIAGNOSIS — J3081 Allergic rhinitis due to animal (cat) (dog) hair and dander: Secondary | ICD-10-CM | POA: Diagnosis not present

## 2021-12-24 DIAGNOSIS — J301 Allergic rhinitis due to pollen: Secondary | ICD-10-CM | POA: Diagnosis not present

## 2021-12-27 DIAGNOSIS — J3081 Allergic rhinitis due to animal (cat) (dog) hair and dander: Secondary | ICD-10-CM | POA: Diagnosis not present

## 2021-12-27 DIAGNOSIS — J3089 Other allergic rhinitis: Secondary | ICD-10-CM | POA: Diagnosis not present

## 2021-12-27 DIAGNOSIS — J301 Allergic rhinitis due to pollen: Secondary | ICD-10-CM | POA: Diagnosis not present

## 2021-12-31 DIAGNOSIS — J3081 Allergic rhinitis due to animal (cat) (dog) hair and dander: Secondary | ICD-10-CM | POA: Diagnosis not present

## 2021-12-31 DIAGNOSIS — J3089 Other allergic rhinitis: Secondary | ICD-10-CM | POA: Diagnosis not present

## 2021-12-31 DIAGNOSIS — J301 Allergic rhinitis due to pollen: Secondary | ICD-10-CM | POA: Diagnosis not present

## 2022-01-08 DIAGNOSIS — J3089 Other allergic rhinitis: Secondary | ICD-10-CM | POA: Diagnosis not present

## 2022-01-08 DIAGNOSIS — J3081 Allergic rhinitis due to animal (cat) (dog) hair and dander: Secondary | ICD-10-CM | POA: Diagnosis not present

## 2022-01-08 DIAGNOSIS — J301 Allergic rhinitis due to pollen: Secondary | ICD-10-CM | POA: Diagnosis not present

## 2022-01-14 DIAGNOSIS — J301 Allergic rhinitis due to pollen: Secondary | ICD-10-CM | POA: Diagnosis not present

## 2022-01-14 DIAGNOSIS — J3081 Allergic rhinitis due to animal (cat) (dog) hair and dander: Secondary | ICD-10-CM | POA: Diagnosis not present

## 2022-01-14 DIAGNOSIS — J3089 Other allergic rhinitis: Secondary | ICD-10-CM | POA: Diagnosis not present

## 2022-01-21 DIAGNOSIS — J301 Allergic rhinitis due to pollen: Secondary | ICD-10-CM | POA: Diagnosis not present

## 2022-01-21 DIAGNOSIS — J3081 Allergic rhinitis due to animal (cat) (dog) hair and dander: Secondary | ICD-10-CM | POA: Diagnosis not present

## 2022-01-21 DIAGNOSIS — J3089 Other allergic rhinitis: Secondary | ICD-10-CM | POA: Diagnosis not present

## 2022-01-28 DIAGNOSIS — J3081 Allergic rhinitis due to animal (cat) (dog) hair and dander: Secondary | ICD-10-CM | POA: Diagnosis not present

## 2022-01-28 DIAGNOSIS — J3089 Other allergic rhinitis: Secondary | ICD-10-CM | POA: Diagnosis not present

## 2022-01-28 DIAGNOSIS — J301 Allergic rhinitis due to pollen: Secondary | ICD-10-CM | POA: Diagnosis not present

## 2022-02-03 DIAGNOSIS — J3089 Other allergic rhinitis: Secondary | ICD-10-CM | POA: Diagnosis not present

## 2022-02-03 DIAGNOSIS — J3081 Allergic rhinitis due to animal (cat) (dog) hair and dander: Secondary | ICD-10-CM | POA: Diagnosis not present

## 2022-02-03 DIAGNOSIS — J301 Allergic rhinitis due to pollen: Secondary | ICD-10-CM | POA: Diagnosis not present

## 2022-02-11 DIAGNOSIS — J3089 Other allergic rhinitis: Secondary | ICD-10-CM | POA: Diagnosis not present

## 2022-02-11 DIAGNOSIS — J3081 Allergic rhinitis due to animal (cat) (dog) hair and dander: Secondary | ICD-10-CM | POA: Diagnosis not present

## 2022-02-11 DIAGNOSIS — J301 Allergic rhinitis due to pollen: Secondary | ICD-10-CM | POA: Diagnosis not present

## 2022-02-18 DIAGNOSIS — J3081 Allergic rhinitis due to animal (cat) (dog) hair and dander: Secondary | ICD-10-CM | POA: Diagnosis not present

## 2022-02-18 DIAGNOSIS — J301 Allergic rhinitis due to pollen: Secondary | ICD-10-CM | POA: Diagnosis not present

## 2022-02-18 DIAGNOSIS — J3089 Other allergic rhinitis: Secondary | ICD-10-CM | POA: Diagnosis not present

## 2022-02-25 DIAGNOSIS — J3089 Other allergic rhinitis: Secondary | ICD-10-CM | POA: Diagnosis not present

## 2022-02-25 DIAGNOSIS — J3081 Allergic rhinitis due to animal (cat) (dog) hair and dander: Secondary | ICD-10-CM | POA: Diagnosis not present

## 2022-02-25 DIAGNOSIS — J301 Allergic rhinitis due to pollen: Secondary | ICD-10-CM | POA: Diagnosis not present

## 2022-02-27 DIAGNOSIS — J301 Allergic rhinitis due to pollen: Secondary | ICD-10-CM | POA: Diagnosis not present

## 2022-02-27 DIAGNOSIS — J3089 Other allergic rhinitis: Secondary | ICD-10-CM | POA: Diagnosis not present

## 2022-02-27 DIAGNOSIS — J3081 Allergic rhinitis due to animal (cat) (dog) hair and dander: Secondary | ICD-10-CM | POA: Diagnosis not present

## 2022-03-04 DIAGNOSIS — J3081 Allergic rhinitis due to animal (cat) (dog) hair and dander: Secondary | ICD-10-CM | POA: Diagnosis not present

## 2022-03-04 DIAGNOSIS — J3089 Other allergic rhinitis: Secondary | ICD-10-CM | POA: Diagnosis not present

## 2022-03-04 DIAGNOSIS — J301 Allergic rhinitis due to pollen: Secondary | ICD-10-CM | POA: Diagnosis not present

## 2022-03-10 DIAGNOSIS — J3081 Allergic rhinitis due to animal (cat) (dog) hair and dander: Secondary | ICD-10-CM | POA: Diagnosis not present

## 2022-03-10 DIAGNOSIS — J301 Allergic rhinitis due to pollen: Secondary | ICD-10-CM | POA: Diagnosis not present

## 2022-03-10 DIAGNOSIS — J3089 Other allergic rhinitis: Secondary | ICD-10-CM | POA: Diagnosis not present

## 2022-03-11 DIAGNOSIS — Z23 Encounter for immunization: Secondary | ICD-10-CM | POA: Diagnosis not present

## 2022-03-21 DIAGNOSIS — J3089 Other allergic rhinitis: Secondary | ICD-10-CM | POA: Diagnosis not present

## 2022-03-21 DIAGNOSIS — J301 Allergic rhinitis due to pollen: Secondary | ICD-10-CM | POA: Diagnosis not present

## 2022-03-21 DIAGNOSIS — J3081 Allergic rhinitis due to animal (cat) (dog) hair and dander: Secondary | ICD-10-CM | POA: Diagnosis not present

## 2022-03-26 DIAGNOSIS — J301 Allergic rhinitis due to pollen: Secondary | ICD-10-CM | POA: Diagnosis not present

## 2022-03-26 DIAGNOSIS — J3089 Other allergic rhinitis: Secondary | ICD-10-CM | POA: Diagnosis not present

## 2022-03-26 DIAGNOSIS — J3081 Allergic rhinitis due to animal (cat) (dog) hair and dander: Secondary | ICD-10-CM | POA: Diagnosis not present

## 2022-04-01 DIAGNOSIS — J301 Allergic rhinitis due to pollen: Secondary | ICD-10-CM | POA: Diagnosis not present

## 2022-04-01 DIAGNOSIS — J3089 Other allergic rhinitis: Secondary | ICD-10-CM | POA: Diagnosis not present

## 2022-04-01 DIAGNOSIS — J3081 Allergic rhinitis due to animal (cat) (dog) hair and dander: Secondary | ICD-10-CM | POA: Diagnosis not present

## 2022-04-08 DIAGNOSIS — J3081 Allergic rhinitis due to animal (cat) (dog) hair and dander: Secondary | ICD-10-CM | POA: Diagnosis not present

## 2022-04-08 DIAGNOSIS — J3089 Other allergic rhinitis: Secondary | ICD-10-CM | POA: Diagnosis not present

## 2022-04-08 DIAGNOSIS — J301 Allergic rhinitis due to pollen: Secondary | ICD-10-CM | POA: Diagnosis not present

## 2022-04-09 ENCOUNTER — Ambulatory Visit (INDEPENDENT_AMBULATORY_CARE_PROVIDER_SITE_OTHER): Payer: Medicaid Other | Admitting: Family Medicine

## 2022-04-09 VITALS — BP 149/89 | HR 61 | Temp 97.0°F | Resp 16 | Ht 68.0 in | Wt 248.0 lb

## 2022-04-09 DIAGNOSIS — Z7689 Persons encountering health services in other specified circumstances: Secondary | ICD-10-CM | POA: Diagnosis not present

## 2022-04-09 DIAGNOSIS — Z6837 Body mass index (BMI) 37.0-37.9, adult: Secondary | ICD-10-CM | POA: Diagnosis not present

## 2022-04-09 DIAGNOSIS — E6609 Other obesity due to excess calories: Secondary | ICD-10-CM | POA: Diagnosis not present

## 2022-04-09 DIAGNOSIS — R030 Elevated blood-pressure reading, without diagnosis of hypertension: Secondary | ICD-10-CM | POA: Diagnosis not present

## 2022-04-09 NOTE — Progress Notes (Signed)
New Patient Office Visit  Subjective    Patient ID: Johnny Barber, male    DOB: 1975-03-21  Age: 47 y.o. MRN: [id number]  CC:  Chief Complaint  Patient presents with   Establish Care    HPI Jameis Koltes presents to establish care. Patient denies acute complaints or concerns.    Outpatient Encounter Medications as of 04/09/2022  Medication Sig   atovaquone-proguanil (MALARONE) 250-100 MG TABS tablet Take 1 tablet by mouth daily.   cyclobenzaprine (FLEXERIL) 10 MG tablet Take 1 tablet (10 mg total) by mouth 3 (three) times daily.   EPINEPHrine 0.3 mg/0.3 mL IJ SOAJ injection SMARTSIG:Injection IM As Directed   ketorolac (TORADOL) 10 MG tablet Take 1 tablet (10 mg total) by mouth every 6 (six) hours as needed.   No facility-administered encounter medications on file as of 04/09/2022.    Past Medical History:  Diagnosis Date   Allergy    Asthma    last asthma flare 17 yr ago     No past surgical history on file.  Family History  Problem Relation Age of Onset   Hypertension Mother    Diabetes Father     Social History   Socioeconomic History   Marital status: Single    Spouse name: Not on file   Number of children: Not on file   Years of education: Not on file   Highest education level: Not on file  Occupational History   Not on file  Tobacco Use   Smoking status: Never   Smokeless tobacco: Never  Substance and Sexual Activity   Alcohol use: Yes    Comment: occ    Drug use: No   Sexual activity: Yes  Other Topics Concern   Not on file  Social History Narrative   Not on file   Social Determinants of Health   Financial Resource Strain: Not on file  Food Insecurity: Not on file  Transportation Needs: Not on file  Physical Activity: Not on file  Stress: Not on file  Social Connections: Not on file  Intimate Partner Violence: Not on file    Review of Systems  All other systems reviewed and are negative.       Objective    BP (!) 149/89    Pulse 61   Temp (!) 97 F (36.1 C) (Oral)   Resp 16   Ht 5\' 8"  (1.727 m)   Wt 248 lb (112.5 kg)   SpO2 96%   BMI 37.71 kg/m   Physical Exam Vitals and nursing note reviewed.  Constitutional:      General: He is not in acute distress.    Appearance: He is obese.  Cardiovascular:     Rate and Rhythm: Normal rate and regular rhythm.  Pulmonary:     Effort: Pulmonary effort is normal.     Breath sounds: Normal breath sounds.  Abdominal:     Palpations: Abdomen is soft.     Tenderness: There is no abdominal tenderness.  Neurological:     General: No focal deficit present.     Mental Status: He is alert and oriented to person, place, and time.  Psychiatric:        Mood and Affect: Mood normal.        Behavior: Behavior normal.         Assessment & Plan:   1. Elevated blood pressure reading in office without diagnosis of hypertension Slightly elevated readings. Will monitor  2. Class 2 obesity due to excess  calories without serious comorbidity with body mass index (BMI) of 37.0 to 37.9 in adult Dietary and activity options discussed.   3. Encounter to establish care     No follow-ups on file.   Becky Sax, MD

## 2022-04-11 ENCOUNTER — Encounter: Payer: Self-pay | Admitting: Family Medicine

## 2022-04-14 DIAGNOSIS — J3089 Other allergic rhinitis: Secondary | ICD-10-CM | POA: Diagnosis not present

## 2022-04-14 DIAGNOSIS — J3081 Allergic rhinitis due to animal (cat) (dog) hair and dander: Secondary | ICD-10-CM | POA: Diagnosis not present

## 2022-04-14 DIAGNOSIS — J301 Allergic rhinitis due to pollen: Secondary | ICD-10-CM | POA: Diagnosis not present

## 2022-04-21 DIAGNOSIS — J301 Allergic rhinitis due to pollen: Secondary | ICD-10-CM | POA: Diagnosis not present

## 2022-04-21 DIAGNOSIS — J3089 Other allergic rhinitis: Secondary | ICD-10-CM | POA: Diagnosis not present

## 2022-04-21 DIAGNOSIS — J3081 Allergic rhinitis due to animal (cat) (dog) hair and dander: Secondary | ICD-10-CM | POA: Diagnosis not present

## 2022-04-28 DIAGNOSIS — J301 Allergic rhinitis due to pollen: Secondary | ICD-10-CM | POA: Diagnosis not present

## 2022-04-28 DIAGNOSIS — J3089 Other allergic rhinitis: Secondary | ICD-10-CM | POA: Diagnosis not present

## 2022-04-28 DIAGNOSIS — J3081 Allergic rhinitis due to animal (cat) (dog) hair and dander: Secondary | ICD-10-CM | POA: Diagnosis not present

## 2022-05-05 DIAGNOSIS — J3089 Other allergic rhinitis: Secondary | ICD-10-CM | POA: Diagnosis not present

## 2022-05-05 DIAGNOSIS — J3081 Allergic rhinitis due to animal (cat) (dog) hair and dander: Secondary | ICD-10-CM | POA: Diagnosis not present

## 2022-05-05 DIAGNOSIS — J301 Allergic rhinitis due to pollen: Secondary | ICD-10-CM | POA: Diagnosis not present

## 2022-05-13 DIAGNOSIS — J3081 Allergic rhinitis due to animal (cat) (dog) hair and dander: Secondary | ICD-10-CM | POA: Diagnosis not present

## 2022-05-13 DIAGNOSIS — J301 Allergic rhinitis due to pollen: Secondary | ICD-10-CM | POA: Diagnosis not present

## 2022-05-13 DIAGNOSIS — J3089 Other allergic rhinitis: Secondary | ICD-10-CM | POA: Diagnosis not present

## 2022-05-19 DIAGNOSIS — J301 Allergic rhinitis due to pollen: Secondary | ICD-10-CM | POA: Diagnosis not present

## 2022-05-19 DIAGNOSIS — J3081 Allergic rhinitis due to animal (cat) (dog) hair and dander: Secondary | ICD-10-CM | POA: Diagnosis not present

## 2022-05-19 DIAGNOSIS — J3089 Other allergic rhinitis: Secondary | ICD-10-CM | POA: Diagnosis not present

## 2022-05-27 DIAGNOSIS — J3089 Other allergic rhinitis: Secondary | ICD-10-CM | POA: Diagnosis not present

## 2022-05-27 DIAGNOSIS — J301 Allergic rhinitis due to pollen: Secondary | ICD-10-CM | POA: Diagnosis not present

## 2022-05-27 DIAGNOSIS — J3081 Allergic rhinitis due to animal (cat) (dog) hair and dander: Secondary | ICD-10-CM | POA: Diagnosis not present

## 2022-06-02 DIAGNOSIS — J3081 Allergic rhinitis due to animal (cat) (dog) hair and dander: Secondary | ICD-10-CM | POA: Diagnosis not present

## 2022-06-02 DIAGNOSIS — J301 Allergic rhinitis due to pollen: Secondary | ICD-10-CM | POA: Diagnosis not present

## 2022-06-02 DIAGNOSIS — J3089 Other allergic rhinitis: Secondary | ICD-10-CM | POA: Diagnosis not present

## 2022-06-10 DIAGNOSIS — J3089 Other allergic rhinitis: Secondary | ICD-10-CM | POA: Diagnosis not present

## 2022-06-10 DIAGNOSIS — J3081 Allergic rhinitis due to animal (cat) (dog) hair and dander: Secondary | ICD-10-CM | POA: Diagnosis not present

## 2022-06-10 DIAGNOSIS — J301 Allergic rhinitis due to pollen: Secondary | ICD-10-CM | POA: Diagnosis not present

## 2022-06-17 DIAGNOSIS — J3081 Allergic rhinitis due to animal (cat) (dog) hair and dander: Secondary | ICD-10-CM | POA: Diagnosis not present

## 2022-06-17 DIAGNOSIS — J3089 Other allergic rhinitis: Secondary | ICD-10-CM | POA: Diagnosis not present

## 2022-06-17 DIAGNOSIS — J301 Allergic rhinitis due to pollen: Secondary | ICD-10-CM | POA: Diagnosis not present

## 2022-07-02 ENCOUNTER — Encounter (HOSPITAL_COMMUNITY): Payer: Self-pay

## 2022-07-02 ENCOUNTER — Emergency Department (HOSPITAL_COMMUNITY)
Admission: EM | Admit: 2022-07-02 | Discharge: 2022-07-02 | Disposition: A | Discharge status: Home / Self Care | Payer: Medicaid Other | Attending: Emergency Medicine | Admitting: Emergency Medicine

## 2022-07-02 ENCOUNTER — Emergency Department (HOSPITAL_COMMUNITY): Payer: Medicaid Other

## 2022-07-02 ENCOUNTER — Other Ambulatory Visit: Payer: Self-pay

## 2022-07-02 DIAGNOSIS — J45909 Unspecified asthma, uncomplicated: Secondary | ICD-10-CM | POA: Insufficient documentation

## 2022-07-02 DIAGNOSIS — M792 Neuralgia and neuritis, unspecified: Secondary | ICD-10-CM | POA: Insufficient documentation

## 2022-07-02 DIAGNOSIS — Z91010 Allergy to peanuts: Secondary | ICD-10-CM | POA: Diagnosis not present

## 2022-07-02 DIAGNOSIS — R519 Headache, unspecified: Secondary | ICD-10-CM | POA: Diagnosis present

## 2022-07-02 MED ORDER — PREDNISONE 10 MG PO TABS: 40.0000 mg | ORAL_TABLET | Freq: Every day | ORAL | 0 refills | Status: AC

## 2022-07-02 MED ORDER — IBUPROFEN 400 MG PO TABS
600.0000 mg | ORAL_TABLET | Freq: Once | ORAL | Status: AC
  Administered 2022-07-02: 600 mg via ORAL
  Filled 2022-07-02: qty 1

## 2022-07-02 NOTE — Discharge Instructions (Signed)
Your CT scan did not show anything new.  Your ears do not look infected at this time.  You may have a neuralgia, basically an inflamed nerve in that area.  A prescription for a steroid was sent to your pharmacy to take for the next 4 days.  I would continue to take ibuprofen and Tylenol as needed.  Follow-up with your primary care doctor.  Return the emergency department for new or worsening symptoms of concern.

## 2022-07-02 NOTE — ED Triage Notes (Signed)
Pt c/o increasing, intermittent pressure/pain in R temple since last week.  Pain score 7/10.  Denies blurred vision and light sensitivity.  Pt reports being in [place] last week when pressure began.

## 2022-07-02 NOTE — ED Provider Notes (Signed)
[facility] Provider Note   CSN: [id number] Arrival date & time: 07/02/22  0850     History  Chief Complaint  Patient presents with   Headache    Johnny Barber is a 47 y.o. male.   Headache Patient presents for headache.  Medical history includes asthma.    He has had braces for the past 5 years.  He gets these tightened every month.  Last tightening was 2 weeks ago.  Over the past week, he has had intermittent sharp pain to his right preauricular area.  Pain episodes typically last for only seconds.  He has taken over-the-counter acetaminophen.  Last night, he had frequent episodes of pain.  For this reason, has been to the ED today.  He denies any other recent symptoms.  He denies any difficulty chewing.  He denies any vision changes.  Currently, he is pain-free.     Home Medications Prior to Admission medications   Medication Sig Start Date End Date Taking? Authorizing Provider  predniSONE (DELTASONE) 10 MG tablet Take 4 tablets (40 mg total) by mouth daily for 4 days. 07/02/22 07/06/22 Yes Gloris Manchester, MD  atovaquone-proguanil (MALARONE) 250-100 MG TABS tablet Take 1 tablet by mouth daily. 03/12/22   [provider]  cyclobenzaprine (FLEXERIL) 10 MG tablet Take 1 tablet (10 mg total) by mouth 3 (three) times daily. 05/31/20   Rodriguez-Southworth, Nettie Elm, PA-C  EPINEPHrine 0.3 mg/0.3 mL IJ SOAJ injection SMARTSIG:Injection IM As Directed 01/09/22   [provider]  ketorolac (TORADOL) 10 MG tablet Take 1 tablet (10 mg total) by mouth every 6 (six) hours as needed. 05/31/20   Rodriguez-Southworth, Nettie Elm, PA-C      Allergies    Dust mite mixed allergen ext [mite (d. farinae)], Mixed grasses, and Peanut-containing drug products    Review of Systems   Review of Systems  Neurological:  Positive for headaches.  All other systems reviewed and are negative.   Physical Exam Updated Vital Signs BP (!) 126/98   Pulse  (!) 55   Temp 97.8 F (36.6 C) (Oral)   Resp 16   SpO2 100%  Physical Exam Vitals and nursing note reviewed.  Constitutional:      General: He is not in acute distress.    Appearance: He is well-developed. He is not ill-appearing, toxic-appearing or diaphoretic.  HENT:     Head: Normocephalic and atraumatic.     Right Ear: Hearing, tympanic membrane, ear canal and external ear normal. No drainage or tenderness. No mastoid tenderness. No hemotympanum.     Left Ear: Hearing, tympanic membrane, ear canal and external ear normal.     Mouth/Throat:     Mouth: Mucous membranes are moist.  Eyes:     Extraocular Movements: Extraocular movements intact.     Conjunctiva/sclera: Conjunctivae normal.  Cardiovascular:     Rate and Rhythm: Normal rate and regular rhythm.  Pulmonary:     Effort: Pulmonary effort is normal. No respiratory distress.  Abdominal:     General: There is no distension.     Palpations: Abdomen is soft.  Musculoskeletal:        General: Normal range of motion.     Cervical back: Neck supple.  Skin:    General: Skin is warm and dry.     Capillary Refill: Capillary refill takes less than 2 seconds.  Neurological:     Mental Status: He is alert and oriented to person, place, and time.  Cranial Nerves: No cranial nerve deficit, dysarthria or facial asymmetry.     Sensory: No sensory deficit.     Motor: No weakness.  Psychiatric:        Mood and Affect: Mood normal.        Speech: Speech normal.        Behavior: Behavior normal.     ED Results / Procedures / Treatments   Labs (all labs ordered are listed, but only abnormal results are displayed) Labs Reviewed - No data to display  EKG None  Radiology CT Head Wo Contrast  Result Date: 07/02/2022 CLINICAL DATA:  Headache. EXAM: CT HEAD WITHOUT CONTRAST TECHNIQUE: Contiguous axial images were obtained from the base of the skull through the vertex without intravenous contrast. RADIATION DOSE REDUCTION: This  exam was performed according to the departmental dose-optimization program which includes automated exposure control, adjustment of the mA and/or kV according to patient size and/or use of iterative reconstruction technique. COMPARISON:  None Available. FINDINGS: Brain: No evidence of acute infarction, hemorrhage, hydrocephalus, extra-axial collection or mass lesion/mass effect. Vascular: No hyperdense vessel or unexpected calcification. Skull: Normal. Negative for fracture or focal lesion. Sinuses/Orbits: Near complete opacification of the left frontal sinus and ethmoid air cells. The orbits are unremarkable. Other: None. IMPRESSION: 1. No acute intracranial abnormality. 2. Left frontal and ethmoid sinus disease. Electronically Signed   By: Obie Dredge M.D.   On: 07/02/2022 09:45    Procedures Procedures    Medications Ordered in ED Medications  ibuprofen (ADVIL) tablet 600 mg (600 mg Oral Given 07/02/22 1137)    ED Course/ Medical Decision Making/ A&P                             Medical Decision Making Amount and/or Complexity of Data Reviewed Radiology: ordered.  Risk Prescription drug management.   This patient presents to the ED for concern of right preauricular pain, this involves an extensive number of treatment options, and is a complaint that carries with it a high risk of complications and morbidity.  The differential diagnosis includes AOM, otitis externa, herpes zoster, cholesteatoma, auricular perichondritis, geniculate neuralgia, other neuralgia, TMJ syndrome   Co morbidities that complicate the patient evaluation  Asthma   Additional history obtained:  Additional history obtained from patient's significant other External records from outside source obtained and reviewed including EMR  Imaging Studies ordered:  I ordered imaging studies including CT head I independently visualized and interpreted imaging which showed no acute findings I agree with the  radiologist interpretation   Cardiac Monitoring: / EKG:  The patient was maintained on a cardiac monitor.  I personally viewed and interpreted the cardiac monitored which showed an underlying rhythm of: Sinus rhythm  Problem List / ED Course / Critical interventions / Medication management  Patient presents for intermittent sharp, stabbing pain to area of right preauricular area.  On arrival in the ED, he is well-appearing.  Vital signs are normal.  He is currently pain-free.  He has no focal neurologic deficits on exam.  Inspection of your canals and TMs are unremarkable.  While in ED, he had an episode of pain.  This lasted for less than a minute.  This has been typical of his recent pain.  He did undergo a CT scan of his head which did not show acute findings.  Etiology of his recent pain may be a neuralgia.  He was prescribed some prednisone and advised to  follow-up with PCP.  He was discharged in stable condition. I ordered medication including ibuprofen for analgesia Reevaluation of the patient after these medicines showed that the patient stayed the same I have reviewed the patients home medicines and have made adjustments as needed   Social Determinants of Health:  Has PCP         Final Clinical Impression(s) / ED Diagnoses Final diagnoses:  Neuralgia    Rx / DC Orders ED Discharge Orders          Ordered    predniSONE (DELTASONE) 10 MG tablet  Daily        07/02/22 1126              Gloris Manchester, MD 07/02/22 1756

## 2022-10-09 ENCOUNTER — Ambulatory Visit (INDEPENDENT_AMBULATORY_CARE_PROVIDER_SITE_OTHER): Payer: Medicaid Other | Admitting: Family Medicine

## 2022-10-09 ENCOUNTER — Encounter: Payer: Self-pay | Admitting: Family Medicine

## 2022-10-09 VITALS — BP 127/84 | HR 65 | Temp 98.1°F | Resp 16 | Wt 239.4 lb

## 2022-10-09 DIAGNOSIS — R030 Elevated blood-pressure reading, without diagnosis of hypertension: Secondary | ICD-10-CM

## 2022-10-09 DIAGNOSIS — Z6837 Body mass index (BMI) 37.0-37.9, adult: Secondary | ICD-10-CM | POA: Diagnosis not present

## 2022-10-09 DIAGNOSIS — E6609 Other obesity due to excess calories: Secondary | ICD-10-CM | POA: Diagnosis not present

## 2022-10-09 NOTE — Progress Notes (Signed)
Established Patient Office Visit  Subjective    Patient ID: Johnny Barber, male    DOB: 05-09-1975  Age: 47 y.o. MRN: [id number]  CC: No chief complaint on file.   HPI Johnny Barber presents for follow up of blood pressure. Patient denies acute complaints or concerns.   Outpatient Encounter Medications as of 10/09/2022  Medication Sig   atovaquone-proguanil (MALARONE) 250-100 MG TABS tablet Take 1 tablet by mouth daily.   cyclobenzaprine (FLEXERIL) 10 MG tablet Take 1 tablet (10 mg total) by mouth 3 (three) times daily.   EPINEPHrine 0.3 mg/0.3 mL IJ SOAJ injection SMARTSIG:Injection IM As Directed   ketorolac (TORADOL) 10 MG tablet Take 1 tablet (10 mg total) by mouth every 6 (six) hours as needed.   No facility-administered encounter medications on file as of 10/09/2022.    Past Medical History:  Diagnosis Date   Allergy    Asthma    last asthma flare 17 yr ago     No past surgical history on file.  Family History  Problem Relation Age of Onset   Hypertension Mother    Diabetes Father     Social History   Socioeconomic History   Marital status: Married    Spouse name: Not on file   Number of children: Not on file   Years of education: Not on file   Highest education level: Not on file  Occupational History   Not on file  Tobacco Use   Smoking status: Never   Smokeless tobacco: Never  Substance and Sexual Activity   Alcohol use: Yes    Comment: occ    Drug use: No   Sexual activity: Yes  Other Topics Concern   Not on file  Social History Narrative   Not on file   Social Determinants of Health   Financial Resource Strain: Low Risk  (10/09/2022)   Overall Financial Resource Strain (CARDIA)    Difficulty of Paying Living Expenses: Not very hard  Food Insecurity: No Food Insecurity (10/09/2022)   Hunger Vital Sign    Worried About Running Out of Food in the Last Year: Never true    Ran Out of Food in the Last Year: Never true  Transportation Needs:  No Transportation Needs (10/09/2022)   PRAPARE - Administrator, Civil Service (Medical): No    Lack of Transportation (Non-Medical): No  Physical Activity: Inactive (10/09/2022)   Exercise Vital Sign    Days of Exercise per Week: 0 days    Minutes of Exercise per Session: 0 min  Stress: No Stress Concern Present (10/09/2022)   [employer] of Occupational Health - Occupational Stress Questionnaire    Feeling of Stress : Not at all  Social Connections: Socially Integrated (10/09/2022)   Social Connection and Isolation Panel [NHANES]    Frequency of Communication with Friends and Family: More than three times a week    Frequency of Social Gatherings with Friends and Family: Three times a week    Attends Religious Services: More than 4 times per year    Active Member of Clubs or Organizations: Patient unable to answer    Attends Club or Organization Meetings: 1 to 4 times per year    Marital Status: Living with partner  Intimate Partner Violence: Not At Risk (10/09/2022)   Humiliation, Afraid, Rape, and Kick questionnaire    Fear of Current or Ex-Partner: No    Emotionally Abused: No    Physically Abused: No    Sexually  Abused: No    Review of Systems  All other systems reviewed and are negative.       Objective    BP 127/84   Pulse 65   Temp 98.1 F (36.7 C) (Oral)   Resp 16   Wt 239 lb 6.4 oz (108.6 kg)   SpO2 98%   BMI 36.40 kg/m   Physical Exam Vitals and nursing note reviewed.  Constitutional:      General: He is not in acute distress.    Appearance: He is obese.  Cardiovascular:     Rate and Rhythm: Normal rate and regular rhythm.  Pulmonary:     Effort: Pulmonary effort is normal.     Breath sounds: Normal breath sounds.  Abdominal:     Palpations: Abdomen is soft.     Tenderness: There is no abdominal tenderness.  Neurological:     General: No focal deficit present.     Mental Status: He is alert and oriented to person, place, and time.   Psychiatric:        Mood and Affect: Mood normal.        Behavior: Behavior normal.         Assessment & Plan:   1. Class 2 obesity due to excess calories without serious comorbidity with body mass index (BMI) of 37.0 to 37.9 in adult   2. Elevated blood pressure reading in office without diagnosis of hypertension BP readings have now normalized. Continue   Return in about 4 weeks (around 11/06/2022) for physical.   Johnny Raymond, MD

## 2022-10-10 ENCOUNTER — Ambulatory Visit: Payer: Medicaid Other | Admitting: Family Medicine

## 2022-11-06 ENCOUNTER — Encounter: Payer: Self-pay | Admitting: Family Medicine

## 2022-11-06 ENCOUNTER — Ambulatory Visit (INDEPENDENT_AMBULATORY_CARE_PROVIDER_SITE_OTHER): Payer: Medicaid Other | Admitting: Family Medicine

## 2022-11-06 VITALS — BP 140/94 | HR 57 | Temp 98.0°F | Resp 16 | Ht 69.0 in | Wt 237.8 lb

## 2022-11-06 DIAGNOSIS — Z13220 Encounter for screening for lipoid disorders: Secondary | ICD-10-CM

## 2022-11-06 DIAGNOSIS — Z0000 Encounter for general adult medical examination without abnormal findings: Secondary | ICD-10-CM | POA: Diagnosis not present

## 2022-11-06 DIAGNOSIS — Z1211 Encounter for screening for malignant neoplasm of colon: Secondary | ICD-10-CM

## 2022-11-06 DIAGNOSIS — Z130 Encounter for screening for diseases of the blood and blood-forming organs and certain disorders involving the immune mechanism: Secondary | ICD-10-CM

## 2022-11-06 NOTE — Progress Notes (Signed)
Established Patient Office Visit  Subjective    Patient ID: Johnny Barber, male    DOB: October 19, 1975  Age: 47 y.o. MRN: [id number]  CC:  Chief Complaint  Patient presents with   Annual Exam    HPI Johnny Barber presents for routine annual exam. Patient denies acute complaints.  Outpatient Encounter Medications as of 11/06/2022  Medication Sig   atovaquone-proguanil (MALARONE) 250-100 MG TABS tablet Take 1 tablet by mouth daily.   cyclobenzaprine (FLEXERIL) 10 MG tablet Take 1 tablet (10 mg total) by mouth 3 (three) times daily.   ketorolac (TORADOL) 10 MG tablet Take 1 tablet (10 mg total) by mouth every 6 (six) hours as needed.   EPINEPHrine 0.3 mg/0.3 mL IJ SOAJ injection SMARTSIG:Injection IM As Directed   No facility-administered encounter medications on file as of 11/06/2022.    Past Medical History:  Diagnosis Date   Allergy    Asthma    last asthma flare 17 yr ago     History reviewed. No pertinent surgical history.  Family History  Problem Relation Age of Onset   Hypertension Mother    Diabetes Father     Social History   Socioeconomic History   Marital status: Married    Spouse name: Not on file   Number of children: Not on file   Years of education: Not on file   Highest education level: Not on file  Occupational History   Not on file  Tobacco Use   Smoking status: Never   Smokeless tobacco: Never  Substance and Sexual Activity   Alcohol use: Yes    Comment: occ    Drug use: No   Sexual activity: Yes  Other Topics Concern   Not on file  Social History Narrative   Not on file   Social Determinants of Health   Financial Resource Strain: Low Risk  (10/09/2022)   Overall Financial Resource Strain (CARDIA)    Difficulty of Paying Living Expenses: Not very hard  Food Insecurity: No Food Insecurity (10/09/2022)   Hunger Vital Sign    Worried About Running Out of Food in the Last Year: Never true    Ran Out of Food in the Last Year: Never  true  Transportation Needs: No Transportation Needs (10/09/2022)   PRAPARE - Administrator, Civil Service (Medical): No    Lack of Transportation (Non-Medical): No  Physical Activity: Inactive (10/09/2022)   Exercise Vital Sign    Days of Exercise per Week: 0 days    Minutes of Exercise per Session: 0 min  Stress: No Stress Concern Present (10/09/2022)   [employer] of Occupational Health - Occupational Stress Questionnaire    Feeling of Stress : Not at all  Social Connections: Socially Integrated (10/09/2022)   Social Connection and Isolation Panel [NHANES]    Frequency of Communication with Friends and Family: More than three times a week    Frequency of Social Gatherings with Friends and Family: Three times a week    Attends Religious Services: More than 4 times per year    Active Member of Clubs or Organizations: Patient unable to answer    Attends Club or Organization Meetings: 1 to 4 times per year    Marital Status: Living with partner  Intimate Partner Violence: Not At Risk (10/09/2022)   Humiliation, Afraid, Rape, and Kick questionnaire    Fear of Current or Ex-Partner: No    Emotionally Abused: No    Physically Abused: No  Sexually Abused: No    Review of Systems  All other systems reviewed and are negative.       Objective    BP (!) 140/94 (BP Location: Right Arm, Patient Position: Sitting, Cuff Size: Large)   Pulse (!) 57   Temp 98 F (36.7 C) (Oral)   Resp 16   Ht 5\' 9"  (1.753 m)   Wt 237 lb 12.8 oz (107.9 kg)   SpO2 96%   BMI 35.12 kg/m   Physical Exam Vitals and nursing note reviewed.  Constitutional:      General: He is not in acute distress. HENT:     Head: Normocephalic and atraumatic.     Right Ear: Tympanic membrane, ear canal and external ear normal.     Left Ear: Tympanic membrane, ear canal and external ear normal.     Nose: Nose normal.     Mouth/Throat:     Mouth: Mucous membranes are moist.     Pharynx: Oropharynx is  clear.  Eyes:     Conjunctiva/sclera: Conjunctivae normal.     Pupils: Pupils are equal, round, and reactive to light.  Neck:     Thyroid: No thyromegaly.  Cardiovascular:     Rate and Rhythm: Normal rate and regular rhythm.     Heart sounds: Normal heart sounds. No murmur heard. Pulmonary:     Effort: Pulmonary effort is normal.     Breath sounds: Normal breath sounds.  Abdominal:     General: There is no distension.     Palpations: Abdomen is soft. There is no mass.     Tenderness: There is no abdominal tenderness.     Hernia: There is no hernia in the left inguinal area or right inguinal area.  Musculoskeletal:        General: Normal range of motion.     Cervical back: Normal range of motion and neck supple.     Right lower leg: No edema.     Left lower leg: No edema.  Skin:    General: Skin is warm and dry.  Neurological:     General: No focal deficit present.     Mental Status: He is alert and oriented to person, place, and time. Mental status is at baseline.  Psychiatric:        Mood and Affect: Mood normal.        Behavior: Behavior normal.         Assessment & Plan:   Annual physical exam -     CMP14+EGFR  Screening for deficiency anemia -     CBC with Differential/Platelet  Screening for lipid disorders -     Lipid panel  Screening for endocrine/metabolic/immunity disorders -     Hemoglobin A1c  Screening for colon cancer -     Cologuard     No follow-ups on file.   Johnny Raymond, MD

## 2022-11-07 LAB — CBC WITH DIFFERENTIAL/PLATELET
Basophils Absolute: 0 10*3/uL (ref 0.0–0.2)
Basos: 1 %
EOS (ABSOLUTE): 0.1 10*3/uL (ref 0.0–0.4)
Eos: 2 %
Hematocrit: 46.9 % (ref 37.5–51.0)
Hemoglobin: 14.4 g/dL (ref 13.0–17.7)
Immature Grans (Abs): 0 10*3/uL (ref 0.0–0.1)
Immature Granulocytes: 0 %
Lymphocytes Absolute: 1.7 10*3/uL (ref 0.7–3.1)
Lymphs: 33 %
MCH: 29.2 pg (ref 26.6–33.0)
MCHC: 30.7 g/dL — ABNORMAL LOW (ref 31.5–35.7)
MCV: 95 fL (ref 79–97)
Monocytes Absolute: 0.4 10*3/uL (ref 0.1–0.9)
Monocytes: 8 %
Neutrophils Absolute: 2.9 10*3/uL (ref 1.4–7.0)
Neutrophils: 56 %
Platelets: 293 10*3/uL (ref 150–450)
RBC: 4.93 x10E6/uL (ref 4.14–5.80)
RDW: 12.1 % (ref 11.6–15.4)
WBC: 5.2 10*3/uL (ref 3.4–10.8)

## 2022-11-07 LAB — CMP14+EGFR
ALT: 19 [IU]/L (ref 0–44)
AST: 19 [IU]/L (ref 0–40)
Albumin: 4.5 g/dL (ref 4.1–5.1)
Alkaline Phosphatase: 70 [IU]/L (ref 44–121)
BUN/Creatinine Ratio: 8 — ABNORMAL LOW (ref 9–20)
BUN: 11 mg/dL (ref 6–24)
Bilirubin Total: 0.7 mg/dL (ref 0.0–1.2)
CO2: 24 mmol/L (ref 20–29)
Calcium: 9.2 mg/dL (ref 8.7–10.2)
Chloride: 102 mmol/L (ref 96–106)
Creatinine, Ser: 1.32 mg/dL — ABNORMAL HIGH (ref 0.76–1.27)
Globulin, Total: 3 g/dL (ref 1.5–4.5)
Glucose: 115 mg/dL — ABNORMAL HIGH (ref 70–99)
Potassium: 4.4 mmol/L (ref 3.5–5.2)
Sodium: 139 mmol/L (ref 134–144)
Total Protein: 7.5 g/dL (ref 6.0–8.5)
eGFR: 67 mL/min/{1.73_m2} (ref >59)

## 2022-11-07 LAB — LIPID PANEL
Chol/HDL Ratio: 5.2 ratio — ABNORMAL HIGH (ref 0.0–5.0)
Cholesterol, Total: 222 mg/dL — ABNORMAL HIGH (ref 100–199)
HDL: 43 mg/dL (ref >39)
LDL Chol Calc (NIH): 166 mg/dL — ABNORMAL HIGH (ref 0–99)
Triglycerides: 75 mg/dL (ref 0–149)
VLDL Cholesterol Cal: 13 mg/dL (ref 5–40)

## 2022-11-07 LAB — HEMOGLOBIN A1C
Est. average glucose Bld gHb Est-mCnc: 114 mg/dL
Hgb A1c MFr Bld: 5.6 % (ref 4.8–5.6)

## 2022-12-04 ENCOUNTER — Ambulatory Visit: Payer: Self-pay | Admitting: Family Medicine

## 2022-12-09 LAB — COLOGUARD: COLOGUARD: NEGATIVE

## 2022-12-15 ENCOUNTER — Encounter: Payer: Self-pay | Admitting: Family Medicine

## 2022-12-15 ENCOUNTER — Ambulatory Visit (INDEPENDENT_AMBULATORY_CARE_PROVIDER_SITE_OTHER): Payer: Medicaid Other | Admitting: Family Medicine

## 2022-12-15 VITALS — BP 149/82 | HR 92 | Temp 98.2°F | Resp 18 | Wt 239.4 lb

## 2022-12-15 DIAGNOSIS — E6609 Other obesity due to excess calories: Secondary | ICD-10-CM | POA: Diagnosis not present

## 2022-12-15 DIAGNOSIS — E66812 Obesity, class 2: Secondary | ICD-10-CM

## 2022-12-15 DIAGNOSIS — Z6835 Body mass index (BMI) 35.0-35.9, adult: Secondary | ICD-10-CM | POA: Diagnosis not present

## 2022-12-15 DIAGNOSIS — R030 Elevated blood-pressure reading, without diagnosis of hypertension: Secondary | ICD-10-CM | POA: Diagnosis not present

## 2022-12-15 DIAGNOSIS — I10 Essential (primary) hypertension: Secondary | ICD-10-CM

## 2022-12-17 ENCOUNTER — Encounter: Payer: Self-pay | Admitting: Family Medicine

## 2022-12-17 NOTE — Progress Notes (Signed)
Established Patient Office Visit  Subjective    Patient ID: Johnny Barber, male    DOB: 29-Jul-1975  Age: 47 y.o. MRN: [id number]  CC:  Chief Complaint  Patient presents with   Follow-up    4 week    HPI Johnny Barber presents for follow up of elevated blood pressure. He has tried dieta and activity modifications. Patient denies acute complaints.   Outpatient Encounter Medications as of 12/15/2022  Medication Sig   EPINEPHrine 0.3 mg/0.3 mL IJ SOAJ injection SMARTSIG:Injection IM As Directed   atovaquone-proguanil (MALARONE) 250-100 MG TABS tablet Take 1 tablet by mouth daily. (Patient not taking: Reported on 12/15/2022)   cyclobenzaprine (FLEXERIL) 10 MG tablet Take 1 tablet (10 mg total) by mouth 3 (three) times daily. (Patient not taking: Reported on 12/15/2022)   ketorolac (TORADOL) 10 MG tablet Take 1 tablet (10 mg total) by mouth every 6 (six) hours as needed. (Patient not taking: Reported on 12/15/2022)   No facility-administered encounter medications on file as of 12/15/2022.    Past Medical History:  Diagnosis Date   Allergy    Asthma    last asthma flare 17 yr ago     History reviewed. No pertinent surgical history.  Family History  Problem Relation Age of Onset   Hypertension Mother    Diabetes Father     Social History   Socioeconomic History   Marital status: Married    Spouse name: Not on file   Number of children: Not on file   Years of education: Not on file   Highest education level: Not on file  Occupational History   Not on file  Tobacco Use   Smoking status: Never   Smokeless tobacco: Never  Substance and Sexual Activity   Alcohol use: Yes    Comment: occ    Drug use: No   Sexual activity: Yes  Other Topics Concern   Not on file  Social History Narrative   Not on file   Social Determinants of Health   Financial Resource Strain: Low Risk  (10/09/2022)   Overall Financial Resource Strain (CARDIA)    Difficulty of Paying Living  Expenses: Not very hard  Food Insecurity: No Food Insecurity (10/09/2022)   Hunger Vital Sign    Worried About Running Out of Food in the Last Year: Never true    Ran Out of Food in the Last Year: Never true  Transportation Needs: No Transportation Needs (10/09/2022)   PRAPARE - Administrator, Civil Service (Medical): No    Lack of Transportation (Non-Medical): No  Physical Activity: Inactive (10/09/2022)   Exercise Vital Sign    Days of Exercise per Week: 0 days    Minutes of Exercise per Session: 0 min  Stress: No Stress Concern Present (10/09/2022)   [employer] of Occupational Health - Occupational Stress Questionnaire    Feeling of Stress : Not at all  Social Connections: Socially Integrated (10/09/2022)   Social Connection and Isolation Panel [NHANES]    Frequency of Communication with Friends and Family: More than three times a week    Frequency of Social Gatherings with Friends and Family: Three times a week    Attends Religious Services: More than 4 times per year    Active Member of Clubs or Organizations: Patient unable to answer    Attends Club or Organization Meetings: 1 to 4 times per year    Marital Status: Living with partner  Intimate Partner Violence: Not At  Risk (10/09/2022)   Humiliation, Afraid, Rape, and Kick questionnaire    Fear of Current or Ex-Partner: No    Emotionally Abused: No    Physically Abused: No    Sexually Abused: No    Review of Systems  All other systems reviewed and are negative.       Objective    BP (!) 149/82 (BP Location: Right Arm, Patient Position: Sitting, Cuff Size: Large)   Pulse 92   Temp 98.2 F (36.8 C) (Oral)   Resp 18   Wt 239 lb 6.4 oz (108.6 kg)   SpO2 96%   BMI 35.35 kg/m   Physical Exam Vitals and nursing note reviewed.  Constitutional:      General: He is not in acute distress.    Appearance: He is obese.  Cardiovascular:     Rate and Rhythm: Normal rate and regular rhythm.  Pulmonary:      Effort: Pulmonary effort is normal.     Breath sounds: Normal breath sounds.  Abdominal:     Palpations: Abdomen is soft.     Tenderness: There is no abdominal tenderness.  Neurological:     General: No focal deficit present.     Mental Status: He is alert and oriented to person, place, and time.  Psychiatric:        Mood and Affect: Mood normal.        Behavior: Behavior normal.         Assessment & Plan:   Elevated blood pressure reading in office without diagnosis of hypertension  Class 2 obesity due to excess calories with body mass index (BMI) of 35.0 to 35.9 in adult, unspecified whether serious comorbidity present   Patient does not want to start an agent at this time. We addressed diet and acctivity options. He has agreed to try with these things as well as weight loss for 3 months before starting any agent.   Return in about 3 months (around 03/15/2023) for follow up.   Johnny Raymond, MD

## 2023-03-18 ENCOUNTER — Ambulatory Visit: Payer: Medicaid Other | Admitting: Family Medicine

## 2023-05-18 ENCOUNTER — Ambulatory Visit: Admitting: Family Medicine

## 2023-07-01 ENCOUNTER — Ambulatory Visit (INDEPENDENT_AMBULATORY_CARE_PROVIDER_SITE_OTHER): Admitting: Family Medicine

## 2023-07-01 VITALS — BP 135/95 | HR 63

## 2023-07-01 DIAGNOSIS — I10 Essential (primary) hypertension: Secondary | ICD-10-CM

## 2023-07-01 MED ORDER — LISINOPRIL 10 MG PO TABS: 10.0000 mg | ORAL_TABLET | Freq: Every day | ORAL | 1 refills | Status: AC

## 2023-07-02 ENCOUNTER — Encounter: Payer: Self-pay | Admitting: Family Medicine

## 2023-07-02 NOTE — Progress Notes (Signed)
 Established Patient Office Visit  Subjective    Patient ID: Johnny Barber, male    DOB: [birth year]  Age: 48 y.o. MRN: [id number]  CC:  Chief Complaint  Patient presents with   Medical Management of Chronic Issues    HPI Johnny Barber presents for follow up of elevated blood pressure. Patient denies acute complaints.   Outpatient Encounter Medications as of 07/01/2023  Medication Sig   EPINEPHrine 0.3 mg/0.3 mL IJ SOAJ injection SMARTSIG:Injection IM As Directed   lisinopril (ZESTRIL) 10 MG tablet Take 1 tablet (10 mg total) by mouth daily.   atovaquone-proguanil (MALARONE) 250-100 MG TABS tablet Take 1 tablet by mouth daily. (Patient not taking: Reported on 07/01/2023)   cyclobenzaprine  (FLEXERIL ) 10 MG tablet Take 1 tablet (10 mg total) by mouth 3 (three) times daily. (Patient not taking: Reported on 07/01/2023)   ketorolac  (TORADOL ) 10 MG tablet Take 1 tablet (10 mg total) by mouth every 6 (six) hours as needed. (Patient not taking: Reported on 07/01/2023)   No facility-administered encounter medications on file as of 07/01/2023.    Past Medical History:  Diagnosis Date   Allergy    Asthma    last asthma flare 17 yr ago     No past surgical history on file.  Family History  Problem Relation Age of Onset   Hypertension Mother    Diabetes Father     Social History   Socioeconomic History   Marital status: Married    Spouse name: Not on file   Number of children: Not on file   Years of education: Not on file   Highest education level: Not on file  Occupational History   Not on file  Tobacco Use   Smoking status: Never   Smokeless tobacco: Never  Substance and Sexual Activity   Alcohol use: Yes    Comment: occ    Drug use: No   Sexual activity: Yes  Other Topics Concern   Not on file  Social History Narrative   Not on file   Social Drivers of Health   Financial Resource Strain: Low Risk  (10/09/2022)   Overall Financial Resource Strain (CARDIA)     Difficulty of Paying Living Expenses: Not very hard  Food Insecurity: No Food Insecurity (10/09/2022)   Hunger Vital Sign    Worried About Running Out of Food in the Last Year: Never true    Ran Out of Food in the Last Year: Never true  Transportation Needs: No Transportation Needs (10/09/2022)   PRAPARE - Administrator, Civil Service (Medical): No    Lack of Transportation (Non-Medical): No  Physical Activity: Inactive (10/09/2022)   Exercise Vital Sign    Days of Exercise per Week: 0 days    Minutes of Exercise per Session: 0 min  Stress: No Stress Concern Present (10/09/2022)   [employer] of Occupational Health - Occupational Stress Questionnaire    Feeling of Stress : Not at all  Social Connections: Socially Integrated (10/09/2022)   Social Connection and Isolation Panel    Frequency of Communication with Friends and Family: More than three times a week    Frequency of Social Gatherings with Friends and Family: Three times a week    Attends Religious Services: More than 4 times per year    Active Member of Clubs or Organizations: Patient unable to answer    Attends Club or Organization Meetings: 1 to 4 times per year    Marital Status: Living with  partner  Intimate Partner Violence: Not At Risk (10/09/2022)   Humiliation, Afraid, Rape, and Kick questionnaire    Fear of Current or Ex-Partner: No    Emotionally Abused: No    Physically Abused: No    Sexually Abused: No    Review of Systems  All other systems reviewed and are negative.       Objective    BP (!) 135/95   Pulse 63   SpO2 98%   Physical Exam Vitals and nursing note reviewed.  Constitutional:      General: He is not in acute distress.    Appearance: He is obese.   Cardiovascular:     Rate and Rhythm: Normal rate and regular rhythm.  Pulmonary:     Effort: Pulmonary effort is normal.     Breath sounds: Normal breath sounds.  Abdominal:     Palpations: Abdomen is soft.     Tenderness:  There is no abdominal tenderness.   Neurological:     General: No focal deficit present.     Mental Status: He is alert and oriented to person, place, and time.   Psychiatric:        Mood and Affect: Mood normal.        Behavior: Behavior normal.         Assessment & Plan:    1. Essential hypertension (Primary) Slightly elevated readings. Will prescribe lisinopril 10 mg daily.    Return in about 6 months (around 12/31/2023) for physical.   Arlo Lama, MD

## 2024-01-01 ENCOUNTER — Ambulatory Visit (INDEPENDENT_AMBULATORY_CARE_PROVIDER_SITE_OTHER): Admitting: Family Medicine

## 2024-01-01 ENCOUNTER — Encounter: Payer: Self-pay | Admitting: Family Medicine

## 2024-01-01 VITALS — BP 144/91 | HR 57 | Ht 69.0 in | Wt 252.2 lb

## 2024-01-01 DIAGNOSIS — Z0000 Encounter for general adult medical examination without abnormal findings: Secondary | ICD-10-CM

## 2024-01-01 DIAGNOSIS — Z1329 Encounter for screening for other suspected endocrine disorder: Secondary | ICD-10-CM

## 2024-01-01 DIAGNOSIS — Z13228 Encounter for screening for other metabolic disorders: Secondary | ICD-10-CM

## 2024-01-01 DIAGNOSIS — Z130 Encounter for screening for diseases of the blood and blood-forming organs and certain disorders involving the immune mechanism: Secondary | ICD-10-CM

## 2024-01-01 DIAGNOSIS — Z136 Encounter for screening for cardiovascular disorders: Secondary | ICD-10-CM | POA: Diagnosis not present

## 2024-01-01 NOTE — Progress Notes (Unsigned)
 "  Established Patient Office Visit  Subjective    Patient ID: Johnny Barber, male    DOB: [birth year]  Age: 48 y.o. MRN: [id number]  CC:  Chief Complaint  Patient presents with   Annual Exam    HPI Johnny Barber presents for routine annual exam. Patient denies acute complaints.   Outpatient Encounter Medications as of 01/01/2024  Medication Sig   EPINEPHrine 0.3 mg/0.3 mL IJ SOAJ injection SMARTSIG:Injection IM As Directed   lisinopril  (ZESTRIL ) 10 MG tablet Take 1 tablet (10 mg total) by mouth daily.   atovaquone-proguanil (MALARONE) 250-100 MG TABS tablet Take 1 tablet by mouth daily. (Patient not taking: Reported on 07/01/2023)   cyclobenzaprine  (FLEXERIL ) 10 MG tablet Take 1 tablet (10 mg total) by mouth 3 (three) times daily. (Patient not taking: Reported on 07/01/2023)   ketorolac  (TORADOL ) 10 MG tablet Take 1 tablet (10 mg total) by mouth every 6 (six) hours as needed. (Patient not taking: Reported on 07/01/2023)   No facility-administered encounter medications on file as of 01/01/2024.    Past Medical History:  Diagnosis Date   Allergy    Asthma    last asthma flare 17 yr ago     History reviewed. No pertinent surgical history.  Family History  Problem Relation Age of Onset   Hypertension Mother    Diabetes Father     Social History   Socioeconomic History   Marital status: Married    Spouse name: Not on file   Number of children: Not on file   Years of education: Not on file   Highest education level: Not on file  Occupational History   Not on file  Tobacco Use   Smoking status: Never   Smokeless tobacco: Never  Substance and Sexual Activity   Alcohol use: Yes    Comment: occ    Drug use: No   Sexual activity: Yes  Other Topics Concern   Not on file  Social History Narrative   Not on file   Social Drivers of Health   Tobacco Use: Low Risk (01/01/2024)   Patient History    Smoking Tobacco Use: Never    Smokeless Tobacco Use: Never     Passive Exposure: Not on file  Financial Resource Strain: Low Risk (10/09/2022)   Overall Financial Resource Strain (CARDIA)    Difficulty of Paying Living Expenses: Not very hard  Food Insecurity: No Food Insecurity (10/09/2022)   Hunger Vital Sign    Worried About Running Out of Food in the Last Year: Never true    Ran Out of Food in the Last Year: Never true  Transportation Needs: No Transportation Needs (10/09/2022)   PRAPARE - Administrator, Civil Service (Medical): No    Lack of Transportation (Non-Medical): No  Physical Activity: Inactive (10/09/2022)   Exercise Vital Sign    Days of Exercise per Week: 0 days    Minutes of Exercise per Session: 0 min  Stress: No Stress Concern Present (10/09/2022)   [employer] of Occupational Health - Occupational Stress Questionnaire    Feeling of Stress : Not at all  Social Connections: Socially Integrated (10/09/2022)   Social Connection and Isolation Panel    Frequency of Communication with Friends and Family: More than three times a week    Frequency of Social Gatherings with Friends and Family: Three times a week    Attends Religious Services: More than 4 times per year    Active Member of Clubs or  Organizations: Patient unable to answer    Attends Club or Organization Meetings: 1 to 4 times per year    Marital Status: Living with partner  Intimate Partner Violence: Not At Risk (10/09/2022)   Humiliation, Afraid, Rape, and Kick questionnaire    Fear of Current or Ex-Partner: No    Emotionally Abused: No    Physically Abused: No    Sexually Abused: No  Depression (PHQ2-9): Low Risk (07/01/2023)   Depression (PHQ2-9)    PHQ-2 Score: 0  Alcohol Screen: Low Risk (10/09/2022)   Alcohol Screen    Last Alcohol Screening Score (AUDIT): 0  Housing: Low Risk (10/09/2022)   Housing    Last Housing Risk Score: 0  Utilities: Not At Risk (10/09/2022)   [facility] Utilities    Threatened with loss of utilities: No  Health Literacy:  Adequate Health Literacy (10/09/2022)   B1300 Health Literacy    Frequency of need for help with medical instructions: Never    Review of Systems  All other systems reviewed and are negative.       Objective    BP (!) 144/91   Pulse (!) 57   Ht 5' 9 (1.753 m)   Wt 252 lb 3.2 oz (114.4 kg)   SpO2 96%   BMI 37.24 kg/m   Physical Exam Vitals and nursing note reviewed.  Constitutional:      General: He is not in acute distress. HENT:     Head: Normocephalic and atraumatic.     Right Ear: Tympanic membrane, ear canal and external ear normal.     Left Ear: Tympanic membrane, ear canal and external ear normal.     Nose: Nose normal.     Mouth/Throat:     Mouth: Mucous membranes are moist.     Pharynx: Oropharynx is clear.  Eyes:     Conjunctiva/sclera: Conjunctivae normal.     Pupils: Pupils are equal, round, and reactive to light.  Neck:     Thyroid : No thyromegaly.  Cardiovascular:     Rate and Rhythm: Normal rate and regular rhythm.     Heart sounds: Normal heart sounds. No murmur heard. Pulmonary:     Effort: Pulmonary effort is normal.     Breath sounds: Normal breath sounds.  Abdominal:     General: There is no distension.     Palpations: Abdomen is soft. There is no mass.     Tenderness: There is no abdominal tenderness.     Hernia: There is no hernia in the left inguinal area or right inguinal area.  Musculoskeletal:        General: Normal range of motion.     Cervical back: Normal range of motion and neck supple.     Right lower leg: No edema.     Left lower leg: No edema.  Skin:    General: Skin is warm and dry.  Neurological:     General: No focal deficit present.     Mental Status: He is alert and oriented to person, place, and time. Mental status is at baseline.  Psychiatric:        Mood and Affect: Mood normal.        Behavior: Behavior normal.         Assessment & Plan:   Annual physical exam -     Comprehensive metabolic panel with  GFR  Screening for deficiency anemia -     CBC with Differential/Platelet  Encounter for screening for cardiovascular disorders -  Lipid panel  Screening for endocrine/metabolic/immunity disorders -     Hemoglobin A1c     No follow-ups on file.   Tanda Raguel SQUIBB, MD  "

## 2024-01-02 LAB — COMPREHENSIVE METABOLIC PANEL WITH GFR
ALT: 27 IU/L (ref 0–44)
AST: 26 IU/L (ref 0–40)
Albumin: 4.7 g/dL (ref 4.1–5.1)
Alkaline Phosphatase: 66 IU/L (ref 47–123)
BUN/Creatinine Ratio: 9 (ref 9–20)
BUN: 12 mg/dL (ref 6–24)
Bilirubin Total: 0.7 mg/dL (ref 0.0–1.2)
CO2: 21 mmol/L (ref 20–29)
Calcium: 9.7 mg/dL (ref 8.7–10.2)
Chloride: 101 mmol/L (ref 96–106)
Creatinine, Ser: 1.41 mg/dL — ABNORMAL HIGH (ref 0.76–1.27)
Globulin, Total: 2.8 g/dL (ref 1.5–4.5)
Glucose: 98 mg/dL (ref 70–99)
Potassium: 5 mmol/L (ref 3.5–5.2)
Sodium: 140 mmol/L (ref 134–144)
Total Protein: 7.5 g/dL (ref 6.0–8.5)
eGFR: 61 mL/min/1.73

## 2024-01-02 LAB — CBC WITH DIFFERENTIAL/PLATELET
Basophils Absolute: 0 x10E3/uL (ref 0.0–0.2)
Basos: 1 %
EOS (ABSOLUTE): 0.1 x10E3/uL (ref 0.0–0.4)
Eos: 3 %
Hematocrit: 42.9 % (ref 37.5–51.0)
Hemoglobin: 14.2 g/dL (ref 13.0–17.7)
Immature Grans (Abs): 0 x10E3/uL (ref 0.0–0.1)
Immature Granulocytes: 0 %
Lymphocytes Absolute: 1.7 x10E3/uL (ref 0.7–3.1)
Lymphs: 38 %
MCH: 30.7 pg (ref 26.6–33.0)
MCHC: 33.1 g/dL (ref 31.5–35.7)
MCV: 93 fL (ref 79–97)
Monocytes Absolute: 0.4 x10E3/uL (ref 0.1–0.9)
Monocytes: 9 %
Neutrophils Absolute: 2.3 x10E3/uL (ref 1.4–7.0)
Neutrophils: 49 %
Platelets: 309 x10E3/uL (ref 150–450)
RBC: 4.63 x10E6/uL (ref 4.14–5.80)
RDW: 12 % (ref 11.6–15.4)
WBC: 4.6 x10E3/uL (ref 3.4–10.8)

## 2024-01-02 LAB — LIPID PANEL
Chol/HDL Ratio: 5.2 ratio — ABNORMAL HIGH (ref 0.0–5.0)
Cholesterol, Total: 227 mg/dL — ABNORMAL HIGH (ref 100–199)
HDL: 44 mg/dL
LDL Chol Calc (NIH): 167 mg/dL — ABNORMAL HIGH (ref 0–99)
Triglycerides: 91 mg/dL (ref 0–149)
VLDL Cholesterol Cal: 16 mg/dL (ref 5–40)

## 2024-01-02 LAB — HEMOGLOBIN A1C
Est. average glucose Bld gHb Est-mCnc: 114 mg/dL
Hgb A1c MFr Bld: 5.6 % (ref 4.8–5.6)

## 2024-01-05 ENCOUNTER — Ambulatory Visit: Payer: Self-pay | Admitting: Family Medicine

## 2025-01-02 ENCOUNTER — Encounter: Payer: Self-pay | Admitting: Family Medicine
